# Patient Record
Sex: Male | Born: 1980
Health system: Southern US, Community
[De-identification: ages and names within clinical notes are randomized; demographics above are authoritative.]

## PROBLEM LIST (undated history)

## (undated) DIAGNOSIS — T7840XA Allergy, unspecified, initial encounter: Secondary | ICD-10-CM

## (undated) HISTORY — PX: FACIAL COSMETIC SURGERY: SHX629

## (undated) HISTORY — DX: Allergy, unspecified, initial encounter: T78.40XA

---

## 2005-09-22 ENCOUNTER — Emergency Department (HOSPITAL_COMMUNITY): Admission: EM | Admit: 2005-09-22 | Discharge: 2005-09-22 | Payer: Self-pay | Admitting: Emergency Medicine

## 2005-09-22 IMAGING — CR DG WRIST COMPLETE 3+V*R*
2 series · 2 of 2 positions shown · non-contrast
Comparison: None.

RIGHT WRIST - 4  VIEW:

CLINICAL DATA: Fall. Wrist pain.

[view not recorded (1 of 2)]
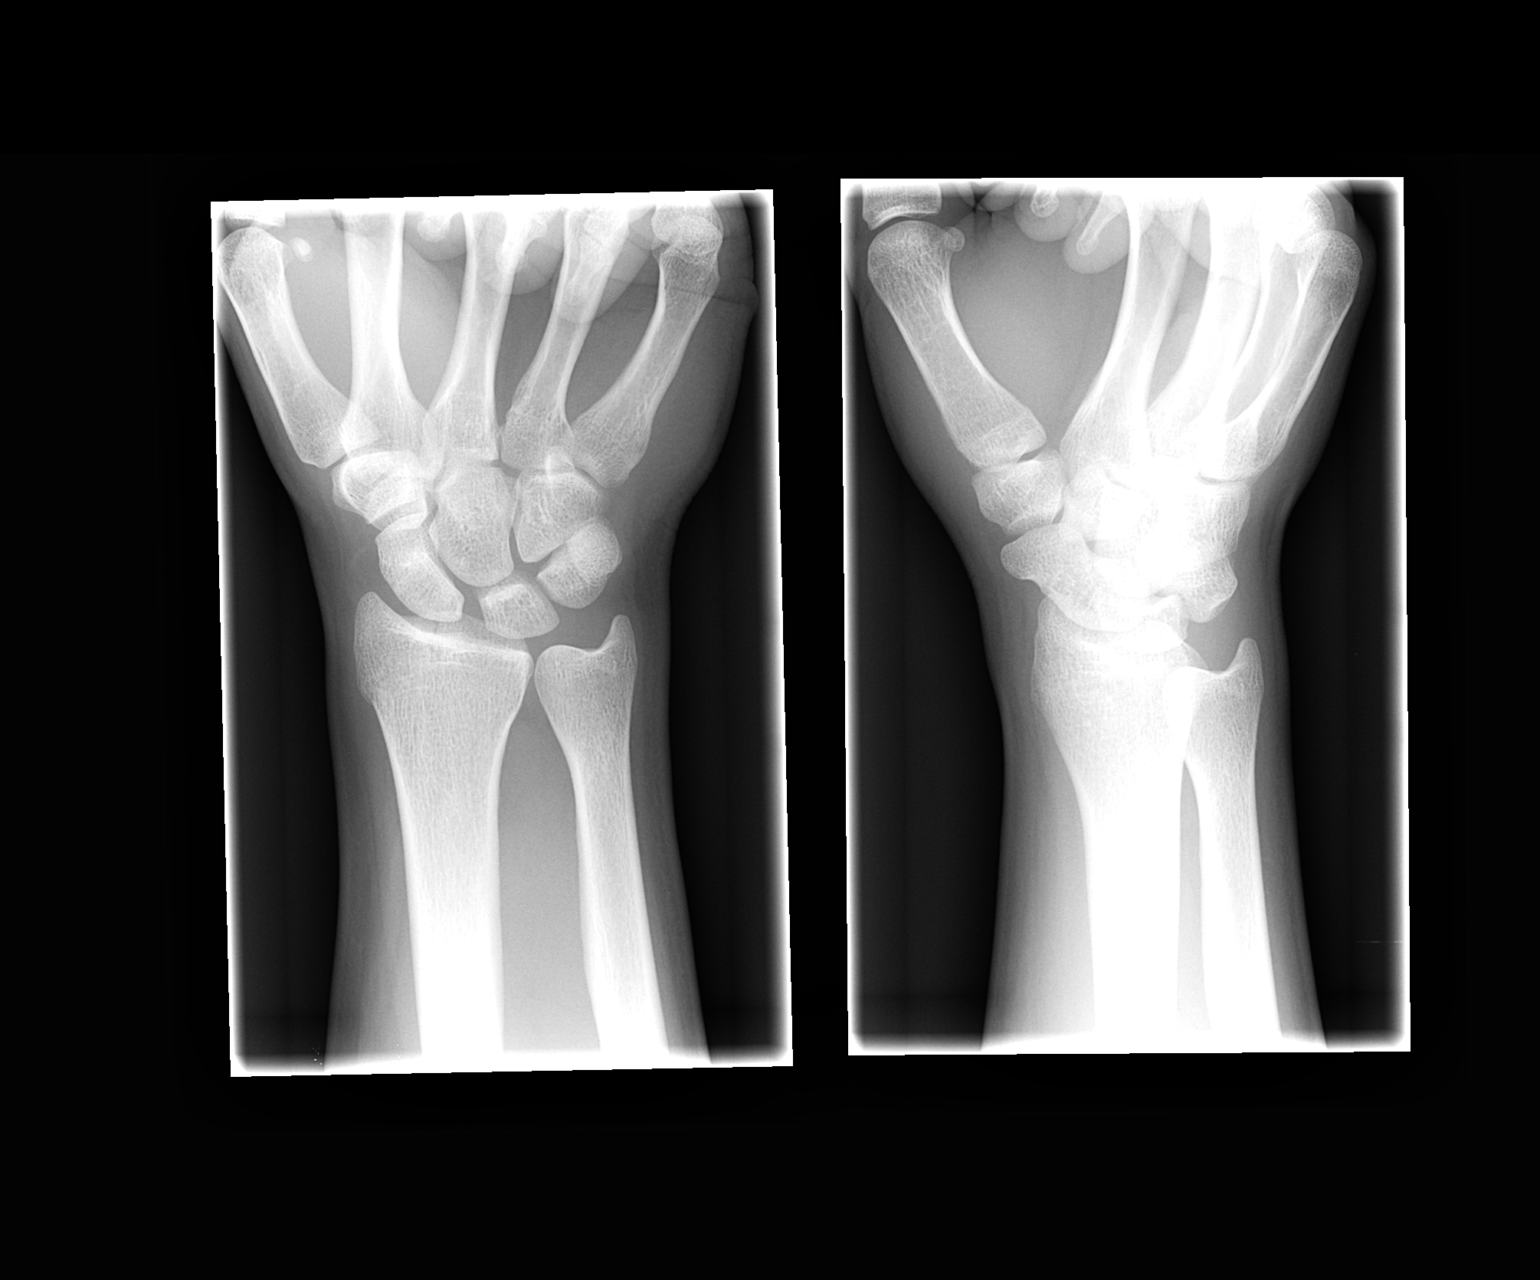

[view not recorded (2 of 2)]
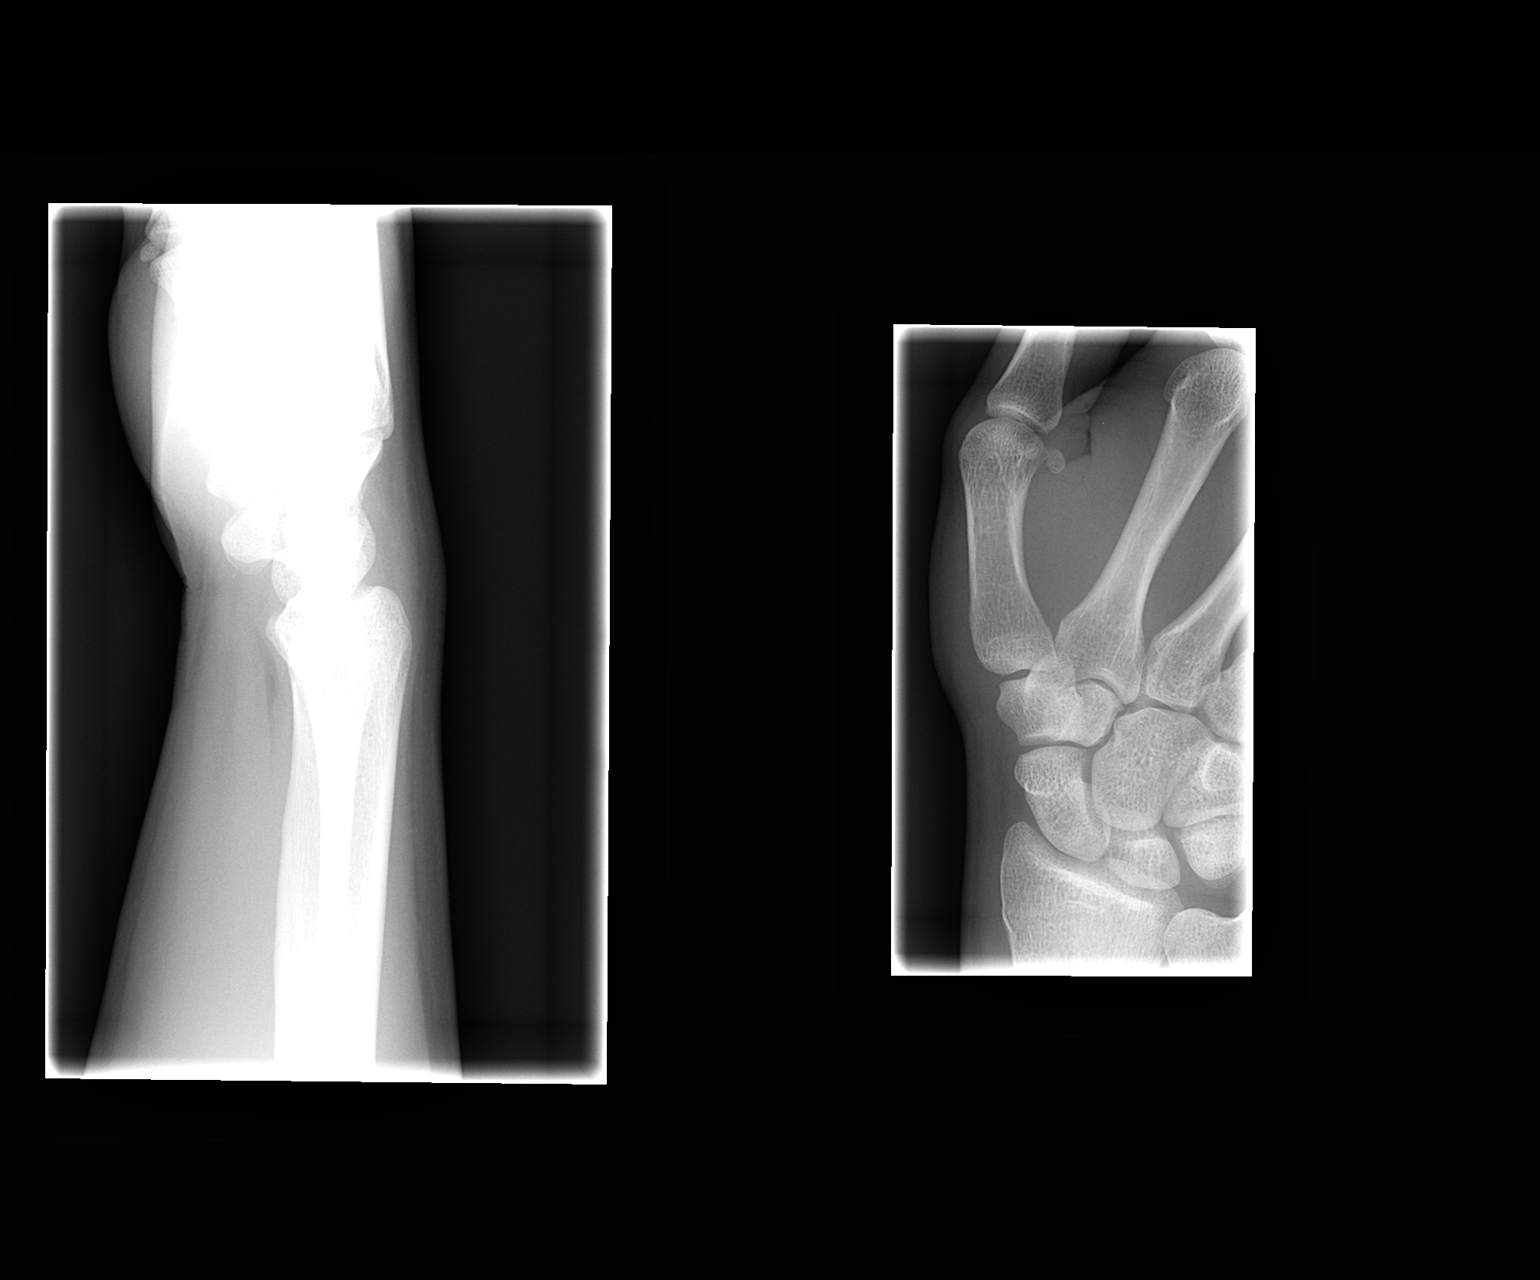

[2 of 2 positions shown; findings below may reference images not displayed]

FINDINGS: A linear lucency is seen in the distal radius, at the articular
surface, on one of the provided images. This cannot be confirmed on any of the
other images. There is borderline widening of the scapholunate distance. No
other acute bony abnormality identified.
IMPRESSION: A linear lucency in the articular surface of the distal radius raises the
question of a nondisplaced fracture, but this is seen on only one view and
cannot be confirmed on any of the other images. Please correlate clinically and
consider followup imaging to further evaluate.

## 2016-03-18 ENCOUNTER — Encounter: Payer: Self-pay | Admitting: *Deleted

## 2016-03-18 ENCOUNTER — Telehealth: Payer: Self-pay | Admitting: *Deleted

## 2016-03-18 NOTE — Telephone Encounter (Signed)
PreVisit Call completed. No acute complaints. Pt states he has had Tdap within 10 years but cannot recall date. Will sign Release Of Records when he comes in. Declines flu vaccine.

## 2016-03-19 ENCOUNTER — Ambulatory Visit (INDEPENDENT_AMBULATORY_CARE_PROVIDER_SITE_OTHER): Payer: Commercial Managed Care - PPO | Admitting: Family Medicine

## 2016-03-19 ENCOUNTER — Encounter: Payer: Self-pay | Admitting: Family Medicine

## 2016-03-19 VITALS — BP 122/78 | HR 63 | Temp 98.4°F | Ht 74.0 in | Wt 198.8 lb

## 2016-03-19 DIAGNOSIS — Z114 Encounter for screening for human immunodeficiency virus [HIV]: Secondary | ICD-10-CM | POA: Diagnosis not present

## 2016-03-19 DIAGNOSIS — Z13220 Encounter for screening for lipoid disorders: Secondary | ICD-10-CM

## 2016-03-19 DIAGNOSIS — Z0000 Encounter for general adult medical examination without abnormal findings: Secondary | ICD-10-CM

## 2016-03-19 LAB — COMPREHENSIVE METABOLIC PANEL
ALT: 14 U/L (ref 0–53)
AST: 16 U/L (ref 0–37)
Albumin: 4.6 g/dL (ref 3.5–5.2)
Alkaline Phosphatase: 50 U/L (ref 39–117)
BUN: 13 mg/dL (ref 6–23)
CO2: 32 mEq/L (ref 19–32)
Calcium: 10.3 mg/dL (ref 8.4–10.5)
Chloride: 101 mEq/L (ref 96–112)
Creatinine, Ser: 1.04 mg/dL (ref 0.40–1.50)
GFR: 86.98 mL/min (ref >60.00)
Glucose, Bld: 97 mg/dL (ref 70–99)
Potassium: 4.6 mEq/L (ref 3.5–5.1)
Sodium: 140 mEq/L (ref 135–145)
Total Bilirubin: 0.7 mg/dL (ref 0.2–1.2)
Total Protein: 7.4 g/dL (ref 6.0–8.3)

## 2016-03-19 LAB — LIPID PANEL
Cholesterol: 207 mg/dL — ABNORMAL HIGH (ref 0–200)
HDL: 57.8 mg/dL (ref >39.00)
LDL Cholesterol: 138 mg/dL — ABNORMAL HIGH (ref 0–99)
NonHDL: 149.14
Total CHOL/HDL Ratio: 4
Triglycerides: 58 mg/dL (ref 0.0–149.0)
VLDL: 11.6 mg/dL (ref 0.0–40.0)

## 2016-03-19 NOTE — Progress Notes (Signed)
Subjective:    Christopher Sexton is a 36 y.o. male and is here for a comprehensive physical exam.  HPI: He has no concerns today.  Health Maintenance Due  Topic Date Due  . HIV Screening  05/23/1997  . TETANUS/TDAP  05/23/2001  . INFLUENZA VACCINE  08/06/2015    PMHx, SurgHx, SocialHx, Medications, and Allergies were reviewed in the Visit Navigator and updated as appropriate.   Past Medical History:  Diagnosis Date  . Allergy      Past Surgical History:  Procedure Laterality Date  . FACIAL COSMETIC SURGERY     as a child     Family History  Problem Relation Age of Onset  . Cancer Mother   . Breast cancer Mother   . Cancer Paternal Grandmother   . Lung cancer Paternal Grandmother   . Cancer Paternal Grandfather   . Lung cancer Paternal Grandfather     Social History  Substance Use Topics  . Smoking status: Former Smoker    Packs/day: 0.50    Years: 6.00    Types: Cigarettes    Quit date: 01/05/2006  . Smokeless tobacco: Never Used  . Alcohol use 1.8 oz/week    2 Shots of liquor, 1 Cans of beer per week    Review of Systems:   Review of Systems  Constitutional: Negative for chills and fever.  HENT: Negative for congestion, ear pain and sinus pain.   Eyes: Negative for blurred vision and double vision.  Respiratory: Negative for cough, shortness of breath and wheezing.   Cardiovascular: Negative for chest pain, palpitations and leg swelling.  Gastrointestinal: Negative for abdominal pain, constipation, diarrhea, nausea and vomiting.  Genitourinary: Negative for dysuria.  Musculoskeletal: Negative for back pain, joint pain and neck pain.  Skin: Negative for rash.  Neurological: Negative for dizziness, weakness and headaches.  Psychiatric/Behavioral: Negative for depression, hallucinations and memory loss.    Objective:   Vitals:   03/19/16 0922  BP: 122/78  Pulse: 63  Temp: 98.4 F (36.9 C)   Body mass index is 25.52 kg/m.  General  Appearance:  Alert, cooperative, no distress, appears stated age  Head:  Normocephalic, without obvious abnormality, atraumatic  Eyes:  PERRL, conjunctiva/corneas clear, EOM's intact, fundi benign, both eyes       Ears:  Normal TM's and external ear canals, both ears  Nose: Nares normal, septum midline, mucosa normal, no drainage    or sinus tenderness  Throat: Lips, mucosa, and tongue normal; teeth and gums normal  Neck: Supple, symmetrical, trachea midline, no adenopathy; thyroid:  No enlargement/tenderness/nodules; no carotit bruit or JVD  Back:   Symmetric, no curvature, ROM normal, no CVA tenderness  Lungs:   Clear to auscultation bilaterally, respirations unlabored  Chest wall:  No tenderness or deformity  Heart:  Regular rate and rhythm, S1 and S2 normal, no murmur, rub   or gallop  Abdomen:   Soft, non-tender, bowel sounds active all four quadrants, no masses, no organomegaly  Extremities: Extremities normal, atraumatic, no cyanosis or edema  Prostate: Not done.   Skin: Skin color, texture, turgor normal, no rashes or lesions  Lymph nodes: Cervical, supraclavicular, and axillary nodes normal  Neurologic: CNII-XII grossly intact. Normal strength, sensation and reflexes throughout    Assessment/Plan:   Christopher Sexton was seen today for annual exam.  Diagnoses and all orders for this visit:  Routine physical examination -     Comprehensive metabolic panel  Screening for lipid disorders -  Lipid panel  Screening for HIV (human immunodeficiency virus) -     HIV antibody  Well Adult Exam: Labs ordered: Yes. Patient counseling was done. See below for items discussed. Discussed the patient's BMI.  The BMI BMI is in the acceptable range Follow up in one year.  Patient Counseling: [x]   Nutrition: Stressed importance of moderation in sodium/caffeine intake, saturated fat and cholesterol, caloric balance, sufficient intake of fresh fruits, vegetables, and fiber.  [x]   Stressed the  importance of regular exercise.   []   Substance Abuse: Discussed cessation/primary prevention of tobacco, alcohol, or other drug use; driving or other dangerous activities under the influence; availability of treatment for abuse.   [x]   Injury prevention: Discussed safety belts, safety helmets, smoke detector, smoking near bedding or upholstery.   []   Sexuality: Discussed sexually transmitted diseases, partner selection, use of condoms, avoidance of unintended pregnancy  and contraceptive alternatives.   [x]   Dental health: Discussed importance of regular tooth brushing, flossing, and dental visits.  [x]   Health maintenance and immunizations reviewed. Please refer to Health maintenance section.   Christopher BottomJamie Sexton CMA acting as scribe for Dr. Earlene PlaterWallace.  CMA served as Neurosurgeonscribe during this visit. History, Physical, and Plan performed by medical provider. Documentation and orders reviewed and attested to. Christopher RimaErica Zebulan Sexton, D.O.

## 2016-03-19 NOTE — Progress Notes (Signed)
Pre visit review using our clinic review tool, if applicable. No additional management support is needed unless otherwise documented below in the visit note. 

## 2016-03-20 LAB — HIV ANTIBODY (ROUTINE TESTING W REFLEX): HIV 1&2 Ab, 4th Generation: NONREACTIVE

## 2016-05-05 ENCOUNTER — Telehealth: Payer: Self-pay | Admitting: Family Medicine

## 2016-05-05 NOTE — Telephone Encounter (Signed)
Received 8 pages from [facility] interoffice mailed them to [facility] Dr.Erica Hotevilla-Bacavi. PWR.Marland Kitchen

## 2016-08-25 ENCOUNTER — Ambulatory Visit (INDEPENDENT_AMBULATORY_CARE_PROVIDER_SITE_OTHER): Payer: Commercial Managed Care - PPO | Admitting: Family Medicine

## 2016-08-25 ENCOUNTER — Encounter: Payer: Self-pay | Admitting: Family Medicine

## 2016-08-25 DIAGNOSIS — F458 Other somatoform disorders: Secondary | ICD-10-CM | POA: Diagnosis not present

## 2016-08-25 DIAGNOSIS — R09A2 Foreign body sensation, throat: Secondary | ICD-10-CM

## 2016-08-25 DIAGNOSIS — R0989 Other specified symptoms and signs involving the circulatory and respiratory systems: Secondary | ICD-10-CM | POA: Insufficient documentation

## 2016-08-25 DIAGNOSIS — R198 Other specified symptoms and signs involving the digestive system and abdomen: Secondary | ICD-10-CM

## 2016-08-25 MED ORDER — RANITIDINE HCL 150 MG PO TABS: 150.0000 mg | ORAL_TABLET | Freq: Two times a day (BID) | ORAL | 0 refills | Status: DC

## 2016-08-25 NOTE — Progress Notes (Signed)
Christopher Sexton is a 36 y.o. male here for an acute visit.  History of Present Illness:   Insurance claims handler, CMA, acting as scribe for Dr. Earlene Sexton.  HPI:  Patient comes in today for an "uncomfortable feeling" in his throat.  States this began a couple of weeks ago.  He states he can feel a lump in the center of his throat at times.  Tends to occur in the middle of the day and not at night or on the morning.  No pain.  He cannot feel this when palpating the outside of his throat.  PMHx, SurgHx, SocialHx, Medications, and Allergies were reviewed in the Visit Navigator and updated as appropriate.  Current Medications:   Current Outpatient Prescriptions:  Marland Kitchen  Multiple Vitamin (MULTIVITAMIN) tablet, Take 1 tablet by mouth daily., Disp: , Rfl:  .  ranitidine (ZANTAC) 150 MG tablet, Take 1 tablet (150 mg total) by mouth 2 (two) times daily., Disp: 30 tablet, Rfl: 0   Allergies  Allergen Reactions  . Bee Venom Hives and Swelling    Pt has been prescribed epi pens for this.    Review of Systems:   Pertinent items are noted in the HPI. Otherwise, ROS is negative.  Vitals:   Vitals:   08/25/16 1008  BP: 126/74  Pulse: 73  Temp: 98.9 F (37.2 C)  TempSrc: Oral  SpO2: 99%  Weight: 193 lb 3.2 oz (87.6 kg)  Height: 6\' 2"  (1.88 m)     Body mass index is 24.81 kg/m. Physical Exam:   Physical Exam  Constitutional: He is oriented to person, place, and time. He appears well-developed and well-nourished. No distress.  HENT:  Head: Normocephalic and atraumatic.  Right Ear: External ear normal.  Left Ear: External ear normal.  Nose: Nose normal.  Mouth/Throat: Oropharynx is clear and moist.  Eyes: Pupils are equal, round, and reactive to light. Conjunctivae and EOM are normal.  Neck: Normal range of motion. Neck supple.  Cardiovascular: Normal rate, regular rhythm, normal heart sounds and intact distal pulses.   Pulmonary/Chest: Effort normal and breath sounds normal.  Abdominal: Soft.  Bowel sounds are normal.  Musculoskeletal: Normal range of motion.  Neurological: He is alert and oriented to person, place, and time.  Skin: Skin is warm and dry.  Psychiatric: He has a normal mood and affect. His behavior is normal. Judgment and thought content normal.  Nursing note and vitals reviewed.   Assessment and Plan:   Christopher Sexton was seen today for acute visit.  Diagnoses and all orders for this visit:  Globus pharyngeus Comments: No red flags. DDx includes PND, reflux, globus sensation. If no improvement in 2 weeks, to ENT. Orders: -     ranitidine (ZANTAC) 150 MG tablet; Take 1 tablet (150 mg total) by mouth 2 (two) times daily.    . Reviewed expectations re: course of current medical issues. . Discussed self-management of symptoms. . Outlined signs and symptoms indicating need for more acute intervention. . Patient verbalized understanding and all questions were answered. Marland Kitchen Health Maintenance issues including appropriate healthy diet, exercise, and smoking avoidance were discussed with patient. . See orders for this visit as documented in the electronic medical record. . Patient received an After Visit Summary.  CMA served as Neurosurgeon during this visit. History, Physical, and Plan performed by medical provider. The above documentation has been reviewed and is accurate and complete. Christopher Sexton, D.O.  Christopher Rima, DO [place] 08/28/2016  No future appointments.

## 2016-08-25 NOTE — Patient Instructions (Addendum)
Globus Pharyngeus Globus pharyngeus is a condition that makes it feel like you have a lump in your throat. It may also feel like you have something stuck in the front of your throat. This feeling may come and go. It is not painful, and it does not make it harder to swallow food or liquid. Globus pharyngeus does not cause changes that a health care provider can see during a physical exam. This condition usually goes away without treatment. What are the causes? Often, no cause can be found. The most common cause of globus pharyngeus is a condition that causes stomach juices to flow back up into the throat (gastroesophageal reflux). Other possible causes include:  Overstimulation of nerves that control swallowing.  Irritation of nerves that control swallowing (neuralgia).  An enlarged gland in the lower neck (thyroid gland).  Growth of tonsil tissue at the base of the tongue (lingual tonsil).  Anxiety.  Depression.  What are the signs or symptoms? The main symptom of this condition is a feeling of a lump in your throat. This feeling usually comes and goes. How is this diagnosed? This condition may be diagnosed after other conditions have been ruled out. You may have tests, such as:  A swallow study.  Ear, nose, and throat evaluation.  An exam of your throat using a thin, flexible tube with a light and camera on the end (endoscopy).  How is this treated? This condition may go away on its own, without treatment. In some cases, antidepressant medicines may be helpful. Follow these instructions at home:  Follow instructions from your health care provider about eating or drinking restrictions.  Take over-the-counter and prescription medicines only as told by your health care provider.  Keep all follow-up visits as told by your health care provider. This is important.  Follow instructions from your health care provider about home care for gastroesophageal reflux. Your health care  provider may recommend that you: ? Do not eat or drink anything that causes heartburn. ? Do not eat heavy meals close to bedtime. ? Do not drink caffeine. ? Do not drink alcohol. ? Raise the head of your bed. ? Sleep on your left side. Contact a health care provider if:  Your symptoms get worse.  You have throat pain.  You have trouble swallowing.  Food or liquid comes back up into your mouth.  You lose weight without trying. Get help right away if:  You develop swelling in your throat. Summary  Globus pharyngeus is a condition that makes it feel like you have a lump in your throat.  This condition usually goes away without treatment. This information is not intended to replace advice given to you by your health care provider. Make sure you discuss any questions you have with your health care provider. Document Released: 08/28/2015 Document Revised: 08/28/2015 Document Reviewed: 08/28/2015 Elsevier Interactive Patient Education  2018 [employer]  

## 2016-11-07 DIAGNOSIS — Z23 Encounter for immunization: Secondary | ICD-10-CM | POA: Diagnosis not present

## 2017-02-18 ENCOUNTER — Other Ambulatory Visit: Payer: Self-pay | Admitting: Family Medicine

## 2017-02-18 DIAGNOSIS — R0989 Other specified symptoms and signs involving the circulatory and respiratory systems: Secondary | ICD-10-CM

## 2017-02-18 DIAGNOSIS — R198 Other specified symptoms and signs involving the digestive system and abdomen: Secondary | ICD-10-CM

## 2017-09-17 ENCOUNTER — Ambulatory Visit: Payer: BLUE CROSS/BLUE SHIELD | Admitting: Family Medicine

## 2017-09-17 ENCOUNTER — Encounter: Payer: Self-pay | Admitting: Family Medicine

## 2017-09-17 VITALS — BP 118/76 | HR 78 | Temp 98.8°F | Ht 74.0 in | Wt 194.0 lb

## 2017-09-17 DIAGNOSIS — M791 Myalgia, unspecified site: Secondary | ICD-10-CM | POA: Diagnosis not present

## 2017-09-17 LAB — COMPREHENSIVE METABOLIC PANEL
ALK PHOS: 59 U/L (ref 39–117)
ALT: 14 U/L (ref 0–53)
AST: 13 U/L (ref 0–37)
Albumin: 4.6 g/dL (ref 3.5–5.2)
BILIRUBIN TOTAL: 0.6 mg/dL (ref 0.2–1.2)
BUN: 15 mg/dL (ref 6–23)
CO2: 30 mEq/L (ref 19–32)
CREATININE: 1.16 mg/dL (ref 0.40–1.50)
Calcium: 10.1 mg/dL (ref 8.4–10.5)
Chloride: 102 mEq/L (ref 96–112)
GFR: 76.01 mL/min (ref >60.00)
GLUCOSE: 71 mg/dL (ref 70–99)
Potassium: 4.3 mEq/L (ref 3.5–5.1)
Sodium: 143 mEq/L (ref 135–145)
TOTAL PROTEIN: 7.1 g/dL (ref 6.0–8.3)

## 2017-09-17 LAB — CBC WITH DIFFERENTIAL/PLATELET
BASOS ABS: 0 10*3/uL (ref 0.0–0.1)
Basophils Relative: 0.5 % (ref 0.0–3.0)
Eosinophils Absolute: 0.1 10*3/uL (ref 0.0–0.7)
Eosinophils Relative: 1.8 % (ref 0.0–5.0)
HCT: 44.7 % (ref 39.0–52.0)
Hemoglobin: 15.6 g/dL (ref 13.0–17.0)
LYMPHS ABS: 2.4 10*3/uL (ref 0.7–4.0)
LYMPHS PCT: 37.9 % (ref 12.0–46.0)
MCHC: 34.8 g/dL (ref 30.0–36.0)
MCV: 91 fl (ref 78.0–100.0)
MONOS PCT: 8.1 % (ref 3.0–12.0)
Monocytes Absolute: 0.5 10*3/uL (ref 0.1–1.0)
NEUTROS PCT: 51.7 % (ref 43.0–77.0)
Neutro Abs: 3.3 10*3/uL (ref 1.4–7.7)
Platelets: 244 10*3/uL (ref 150.0–400.0)
RBC: 4.91 Mil/uL (ref 4.22–5.81)
RDW: 12.8 % (ref 11.5–15.5)
WBC: 6.3 10*3/uL (ref 4.0–10.5)

## 2017-09-17 LAB — SEDIMENTATION RATE: Sed Rate: 3 mm/hr (ref 0–15)

## 2017-09-17 LAB — C-REACTIVE PROTEIN: CRP: 0.1 mg/dL — ABNORMAL LOW (ref 0.5–20.0)

## 2017-09-17 LAB — TSH: TSH: 0.83 u[IU]/mL (ref 0.35–4.50)

## 2017-09-17 MED ORDER — BACLOFEN 10 MG PO TABS
10.0000 mg | ORAL_TABLET | Freq: Three times a day (TID) | ORAL | 0 refills | Status: DC
Start: 2017-09-17 — End: 2018-02-28

## 2017-09-17 NOTE — Progress Notes (Signed)
Patient: Christopher Sexton MRN: [id number] DOB: Feb 09, 1980 PCP: Helane Rima, DO     Subjective:  Chief Complaint  Patient presents with  . chest muscles/abd muscles sore    x 1 month    HPI: The patient is a 37 y.o. male who presents today for chest/abdominal muscle soreness x 1 month. His symptoms are intermittent. One month ago when symptoms started he can not find a precipitating factors. He does not work out and no recent exercise routine. No new diets and no new medication. He seems to notice his symptoms more when he is traveling in his car. This AM pain seemed to be located on his right side of his rib cage. Would come and go about 15 times so he came in for appointment today. Sometimes the pain lingers and sometimes its quick. Pain travels all over his stomach and in his pec regions. Nowhere else on his body. Pain is tolerable and is dull. No radiation when he feels the pain. No symptoms in his legs/arms. He did take ibuprofen and this seemed to help.  He is healthy with no medical problems and no medication. No significant medical history. He is tall and skinny. Not double jointed and had no issues with anything growing up. He denies any joint pain or swelling anywhere else. No rashes or skin complaints. No vision complaints. No radicular symptoms or weakness.   Review of Systems  Constitutional: Negative for fatigue.  Eyes: Negative for visual disturbance.  Respiratory: Positive for chest tightness. Negative for shortness of breath.   Cardiovascular: Negative for chest pain.  Gastrointestinal: Negative for abdominal pain and nausea.  Neurological: Negative for dizziness and headaches.    Allergies Patient is allergic to bee venom.  Past Medical History Patient  has a past medical history of Allergy.  Surgical History Patient  has a past surgical history that includes Facial cosmetic surgery.  Family History Pateint's family history includes Breast cancer in his mother;  Cancer in his mother, paternal grandfather, and paternal grandmother; Lung cancer in his paternal grandfather and paternal grandmother.  Social History Patient  reports that he quit smoking about 11 years ago. His smoking use included cigarettes. He has a 3.00 pack-year smoking history. He has never used smokeless tobacco. He reports that he drinks about 3.0 standard drinks of alcohol per week. He reports that he does not use drugs.    Objective: Vitals:   09/17/17 1307  BP: 118/76  Pulse: 78  Temp: 98.8 F (37.1 C)  TempSrc: Oral  SpO2: 98%  Weight: 194 lb (88 kg)  Height: 6\' 2"  (1.88 m)    Body mass index is 24.91 kg/m.  Physical Exam  Constitutional: He is oriented to person, place, and time. He appears well-developed and well-nourished.  HENT:  Right Ear: External ear normal.  Left Ear: External ear normal.  Mild cobblestoning on posterior pharynx   Eyes: Pupils are equal, round, and reactive to light. Conjunctivae and EOM are normal.  Neck: Normal range of motion. Neck supple. No thyromegaly present.  Cardiovascular: Normal rate, regular rhythm, normal heart sounds and intact distal pulses.  No murmur heard. Pulmonary/Chest: Effort normal and breath sounds normal.  Abdominal: Soft. Bowel sounds are normal. He exhibits no distension. There is no tenderness.  Musculoskeletal: Normal range of motion. He exhibits no edema, tenderness or deformity.  No reproducible tenderness on palpation over chest/ribs.   Lymphadenopathy:    He has no cervical adenopathy.  Neurological: He is alert and oriented  to person, place, and time. He displays normal reflexes. No cranial nerve deficit. Coordination normal.  Skin: Skin is warm and dry. No rash noted.  Psychiatric: He has a normal mood and affect. His behavior is normal.  Vitals reviewed.      Assessment/plan: 1. Myalgia Unsure at this point and large differential. Responds to anti-inflammatories so will continue these PRN.  Checking labs to rule out other issues. Anterior cutaneous nerve entrapment on differential, but wouldn't have symptoms in his pectoralis muscles. Also want him to figure out if he is having this after he wrestles with the kids or does sit in car for long trips. Will also do trial of muscle relaxer prn. If symptoms continue/worsen will have him follow up with pcp for further care/work up.  - CBC with Differential/Platelet - Comprehensive metabolic panel - TSH - Sedimentation rate - C-reactive protein - ANA   Return if symptoms worsen or fail to improve.    Orland MustardAllison Emerick Weatherly, MD [facility]   09/17/2017

## 2017-09-22 LAB — ANA: ANA: NEGATIVE

## 2017-10-01 ENCOUNTER — Ambulatory Visit: Payer: Commercial Managed Care - PPO | Admitting: Family Medicine

## 2018-02-28 ENCOUNTER — Encounter: Payer: Self-pay | Admitting: Family Medicine

## 2018-02-28 ENCOUNTER — Ambulatory Visit: Payer: BLUE CROSS/BLUE SHIELD | Admitting: Family Medicine

## 2018-02-28 VITALS — BP 108/64 | HR 57 | Temp 98.2°F | Ht 74.0 in | Wt 199.8 lb

## 2018-02-28 DIAGNOSIS — G44209 Tension-type headache, unspecified, not intractable: Secondary | ICD-10-CM

## 2018-02-28 MED ORDER — BACLOFEN 10 MG PO TABS: 10.0000 mg | ORAL_TABLET | Freq: Three times a day (TID) | ORAL | 0 refills | Status: DC

## 2018-02-28 MED ORDER — KETOROLAC TROMETHAMINE 60 MG/2ML IM SOLN
60.0000 mg | Freq: Once | INTRAMUSCULAR | Status: AC
  Administered 2018-02-28: 60 mg via INTRAMUSCULAR

## 2018-02-28 NOTE — Patient Instructions (Addendum)
Will give you toradol shot today. Don't take ibuprofen/advil/etc for 6-8 hours after shot. Can take tylenol.   -I prefer ibuprofen 800mg  up to three times a day with food 1000mg  of tylenol three times a day max.   Let us know if you are not getting better after a few days or worsening symptoms.    Tension Headache, Adult A tension headache is pain, pressure, or aching in your head. Tension headaches can last from 30 minutes to several days. Follow these instructions at home: Managing pain  Take over-the-counter and prescription medicines only as told by your doctor.  When you have a headache, lie down in a dark, quiet room.  If told, put ice on your head and neck: ? Put ice in a plastic bag. ? Place a towel between your skin and the bag. ? Leave the ice on for 20 minutes, 2-3 times a day.  If told, put heat on the back of your neck. Do this as often as your doctor tells you to. Use the kind of heat that your doctor recommends, such as a moist heat pack or a heating pad. ? Place a towel between your skin and the heat. ? Leave the heat on for 20-30 minutes. ? Remove the heat if your skin turns bright red. Eating and drinking  Eat meals on a regular schedule.  Watch how much alcohol you drink: ? If you are a woman and are not pregnant, do not drink more than 1 drink a day. ? If you are a man, do not drink more than 2 drinks a day.  Drink enough fluid to keep your pee (urine) pale yellow.  Do not use a lot of caffeine, or stop using caffeine. Lifestyle  Get enough sleep. Get 7-9 hours of sleep each night. Or get the amount of sleep that your doctor tells you to.  At bedtime, remove all electronic devices from your room. Examples of electronic devices are computers, phones, and tablets.  Find ways to lessen your stress. Some things that can lessen stress are: ? Exercise. ? Deep breathing. ? Yoga. ? Music. ? Positive thoughts.  Sit up straight. Do not tighten (tense) your  muscles.  Do not use any products that have nicotine or tobacco in them, such as cigarettes and e-cigarettes. If you need help quitting, ask your doctor. General instructions   Keep all follow-up visits as told by your doctor. This is important.  Avoid things that can bring on headaches. Keep a journal to find out if certain things bring on headaches. For example, write down: ? What you eat and drink. ? How much sleep you get. ? Any change to your diet or medicines. Contact a doctor if:  Your headache does not get better.  Your headache comes back.  You have a headache and sounds, light, or smells bother you.  You feel sick to your stomach (nauseous) or you throw up (vomit).  Your stomach hurts. Get help right away if:  You suddenly get a very bad headache along with any of these: ? A stiff neck. ? Feeling sick to your stomach. ? Throwing up. ? Feeling weak. ? Trouble seeing. ? Feeling short of breath. ? A rash. ? Feeling unusually sleepy. ? Trouble speaking. ? Pain in your eye or ear. ? Trouble walking or balancing. ? Feeling like you will pass out (faint). ? Passing out. Summary  A tension headache is pain, pressure, or aching in your head.  Tension headaches can  last from 30 minutes to several days.  Lifestyle changes and medicines may help relieve pain. This information is not intended to replace advice given to you by your health care provider. Make sure you discuss any questions you have with your health care provider. Document Released: 03/18/2009 Document Revised: 04/03/2016 Document Reviewed: 04/03/2016 Elsevier Interactive Patient Education  2019 [employer].

## 2018-02-28 NOTE — Progress Notes (Signed)
Patient: DUILIO STETLER MRN: [id number] DOB: [birth year] PCP: Helane Rima, DO     Subjective:  Chief Complaint  Patient presents with  . Headache    HPI: The patient is a 38 y.o. male who presents today for headache.  He has had a headache for 2 days. He usually never has headaches. His mom and his brother have migraines, but he has no history. If he does get a headache they normally go away quickly. He took advil yesterday Am and evening and it didn't really touch it. He took some more this AM. He states headache started out on the right side of his entire head then this AM again, started out with no headache then came on the right side again. Rated as a 5-6/10 and is dull in nature. More pressure in nature. He doesn't feel more stressed than normal, sleeping normal (8-10 hours) and not drank more than normal. He denies any nausea/vomiting/vision changes. No light sensitivity. Not worst headache of his life, doesn't wake him up and no focal deficits.   Review of Systems  Constitutional: Negative for fatigue.  HENT: Negative for congestion, ear pain, rhinorrhea, sinus pressure, sinus pain and sore throat.   Respiratory: Negative for shortness of breath and wheezing.   Cardiovascular: Negative for chest pain.  Gastrointestinal: Negative for abdominal pain, nausea and vomiting.  Musculoskeletal: Negative for neck pain and neck stiffness.  Neurological: Positive for headaches. Negative for dizziness.  Hematological: Negative for adenopathy.  Psychiatric/Behavioral: Negative for sleep disturbance.    Allergies Patient is allergic to bee venom.  Past Medical History Patient  has a past medical history of Allergy.  Surgical History Patient  has a past surgical history that includes Facial cosmetic surgery.  Family History Pateint's family history includes Breast cancer in his mother; Cancer in his mother, paternal grandfather, and paternal grandmother; Lung cancer in his  paternal grandfather and paternal grandmother.  Social History Patient  reports that he quit smoking about 12 years ago. His smoking use included cigarettes. He has a 3.00 pack-year smoking history. He has never used smokeless tobacco. He reports current alcohol use of about 3.0 standard drinks of alcohol per week. He reports that he does not use drugs.    Objective: Vitals:   02/28/18 1035  BP: 108/64  Pulse: (!) 57  Temp: 98.2 F (36.8 C)  TempSrc: Oral  SpO2: 98%  Weight: 199 lb 12.8 oz (90.6 kg)  Height: 6\' 2"  (1.88 m)    Body mass index is 25.65 kg/m.  Physical Exam Vitals signs reviewed.  Constitutional:      Appearance: He is well-developed.  HENT:     Head: Normocephalic and atraumatic.     Mouth/Throat:     Mouth: Mucous membranes are moist.     Pharynx: Oropharynx is clear.  Eyes:     Extraocular Movements: Extraocular movements intact.     Pupils: Pupils are equal, round, and reactive to light.  Neck:     Musculoskeletal: Normal range of motion and neck supple.  Cardiovascular:     Rate and Rhythm: Normal rate and regular rhythm.     Heart sounds: Normal heart sounds.  Pulmonary:     Effort: Pulmonary effort is normal.     Breath sounds: Normal breath sounds.  Abdominal:     General: Bowel sounds are normal.     Palpations: Abdomen is soft.  Skin:    General: Skin is warm and dry.  Neurological:     Mental  Status: He is alert.     Cranial Nerves: No cranial nerve deficit, dysarthria or facial asymmetry.     Sensory: No sensory deficit.     Coordination: Coordination normal.     Gait: Gait normal.  Psychiatric:        Mood and Affect: Mood normal.        Behavior: Behavior normal.        Assessment/plan: 1. Acute non intractable tension-type headache toradol in office today. Discussed these can persist for a couple of weeks. Usually stress related. Red flags/precautions discussed. If not better in a few days would let us know so we can  image/further investigate. Also recommended ibuprofen and could try tylenol. Dosing discussed/written down. No nsaid x 6-8 hours after shot.  Rest, fluids.  - ketorolac (TORADOL) injection 60 mg    Return if symptoms worsen or fail to improve.   Orland Mustard, MD [facility]   02/28/2018

## 2018-05-29 ENCOUNTER — Encounter: Payer: Self-pay | Admitting: Family Medicine

## 2018-05-30 ENCOUNTER — Emergency Department (HOSPITAL_COMMUNITY)
Admission: EM | Admit: 2018-05-30 | Discharge: 2018-05-30 | Disposition: A | Discharge status: Home / Self Care | Payer: BLUE CROSS/BLUE SHIELD | Attending: Emergency Medicine | Admitting: Emergency Medicine

## 2018-05-30 ENCOUNTER — Other Ambulatory Visit: Payer: Self-pay

## 2018-05-30 ENCOUNTER — Encounter (HOSPITAL_COMMUNITY): Payer: Self-pay | Admitting: *Deleted

## 2018-05-30 ENCOUNTER — Emergency Department (HOSPITAL_COMMUNITY): Payer: BLUE CROSS/BLUE SHIELD

## 2018-05-30 DIAGNOSIS — R0781 Pleurodynia: Secondary | ICD-10-CM | POA: Diagnosis not present

## 2018-05-30 DIAGNOSIS — Z87891 Personal history of nicotine dependence: Secondary | ICD-10-CM | POA: Insufficient documentation

## 2018-05-30 DIAGNOSIS — Z79899 Other long term (current) drug therapy: Secondary | ICD-10-CM | POA: Diagnosis not present

## 2018-05-30 DIAGNOSIS — R1011 Right upper quadrant pain: Secondary | ICD-10-CM | POA: Diagnosis not present

## 2018-05-30 DIAGNOSIS — R109 Unspecified abdominal pain: Secondary | ICD-10-CM | POA: Diagnosis not present

## 2018-05-30 LAB — CBC WITH DIFFERENTIAL/PLATELET
Abs Immature Granulocytes: 0.02 10*3/uL (ref 0.00–0.07)
Basophils Absolute: 0 10*3/uL (ref 0.0–0.1)
Basophils Relative: 0 %
Eosinophils Absolute: 0.1 10*3/uL (ref 0.0–0.5)
Eosinophils Relative: 1 %
HCT: 42.7 % (ref 39.0–52.0)
Hemoglobin: 15.1 g/dL (ref 13.0–17.0)
Immature Granulocytes: 0 %
Lymphocytes Relative: 30 %
Lymphs Abs: 2.2 10*3/uL (ref 0.7–4.0)
MCH: 31.3 pg (ref 26.0–34.0)
MCHC: 35.4 g/dL (ref 30.0–36.0)
MCV: 88.4 fL (ref 80.0–100.0)
Monocytes Absolute: 0.7 10*3/uL (ref 0.1–1.0)
Monocytes Relative: 10 %
Neutro Abs: 4.3 10*3/uL (ref 1.7–7.7)
Neutrophils Relative %: 59 %
Platelets: 204 10*3/uL (ref 150–400)
RBC: 4.83 MIL/uL (ref 4.22–5.81)
RDW: 11.6 % (ref 11.5–15.5)
WBC: 7.4 10*3/uL (ref 4.0–10.5)
nRBC: 0 % (ref 0.0–0.2)

## 2018-05-30 LAB — BASIC METABOLIC PANEL
Anion gap: 12 (ref 5–15)
BUN: 18 mg/dL (ref 6–20)
CO2: 26 mmol/L (ref 22–32)
Calcium: 9.6 mg/dL (ref 8.9–10.3)
Chloride: 102 mmol/L (ref 98–111)
Creatinine, Ser: 1.12 mg/dL (ref 0.61–1.24)
GFR calc Af Amer: >60 mL/min (ref >60)
GFR calc non Af Amer: >60 mL/min (ref >60)
Glucose, Bld: 111 mg/dL — ABNORMAL HIGH (ref 70–99)
Potassium: 4.4 mmol/L (ref 3.5–5.1)
Sodium: 140 mmol/L (ref 135–145)

## 2018-05-30 LAB — TROPONIN I: Troponin I: <0.03 ng/mL (ref <0.03)

## 2018-05-30 LAB — D-DIMER, QUANTITATIVE (NOT AT ARMC): D-Dimer, Quant: <0.27 ug/mL-FEU (ref 0.00–0.50)

## 2018-05-30 IMAGING — CR RIGHT RIBS AND CHEST - 3+ VIEW
3 series · 3 of 3 positions shown · non-contrast
Comparison: None.

CLINICAL DATA: Pain

EXAM:
RIGHT RIBS AND CHEST - 3+ VIEW

[chest pa]
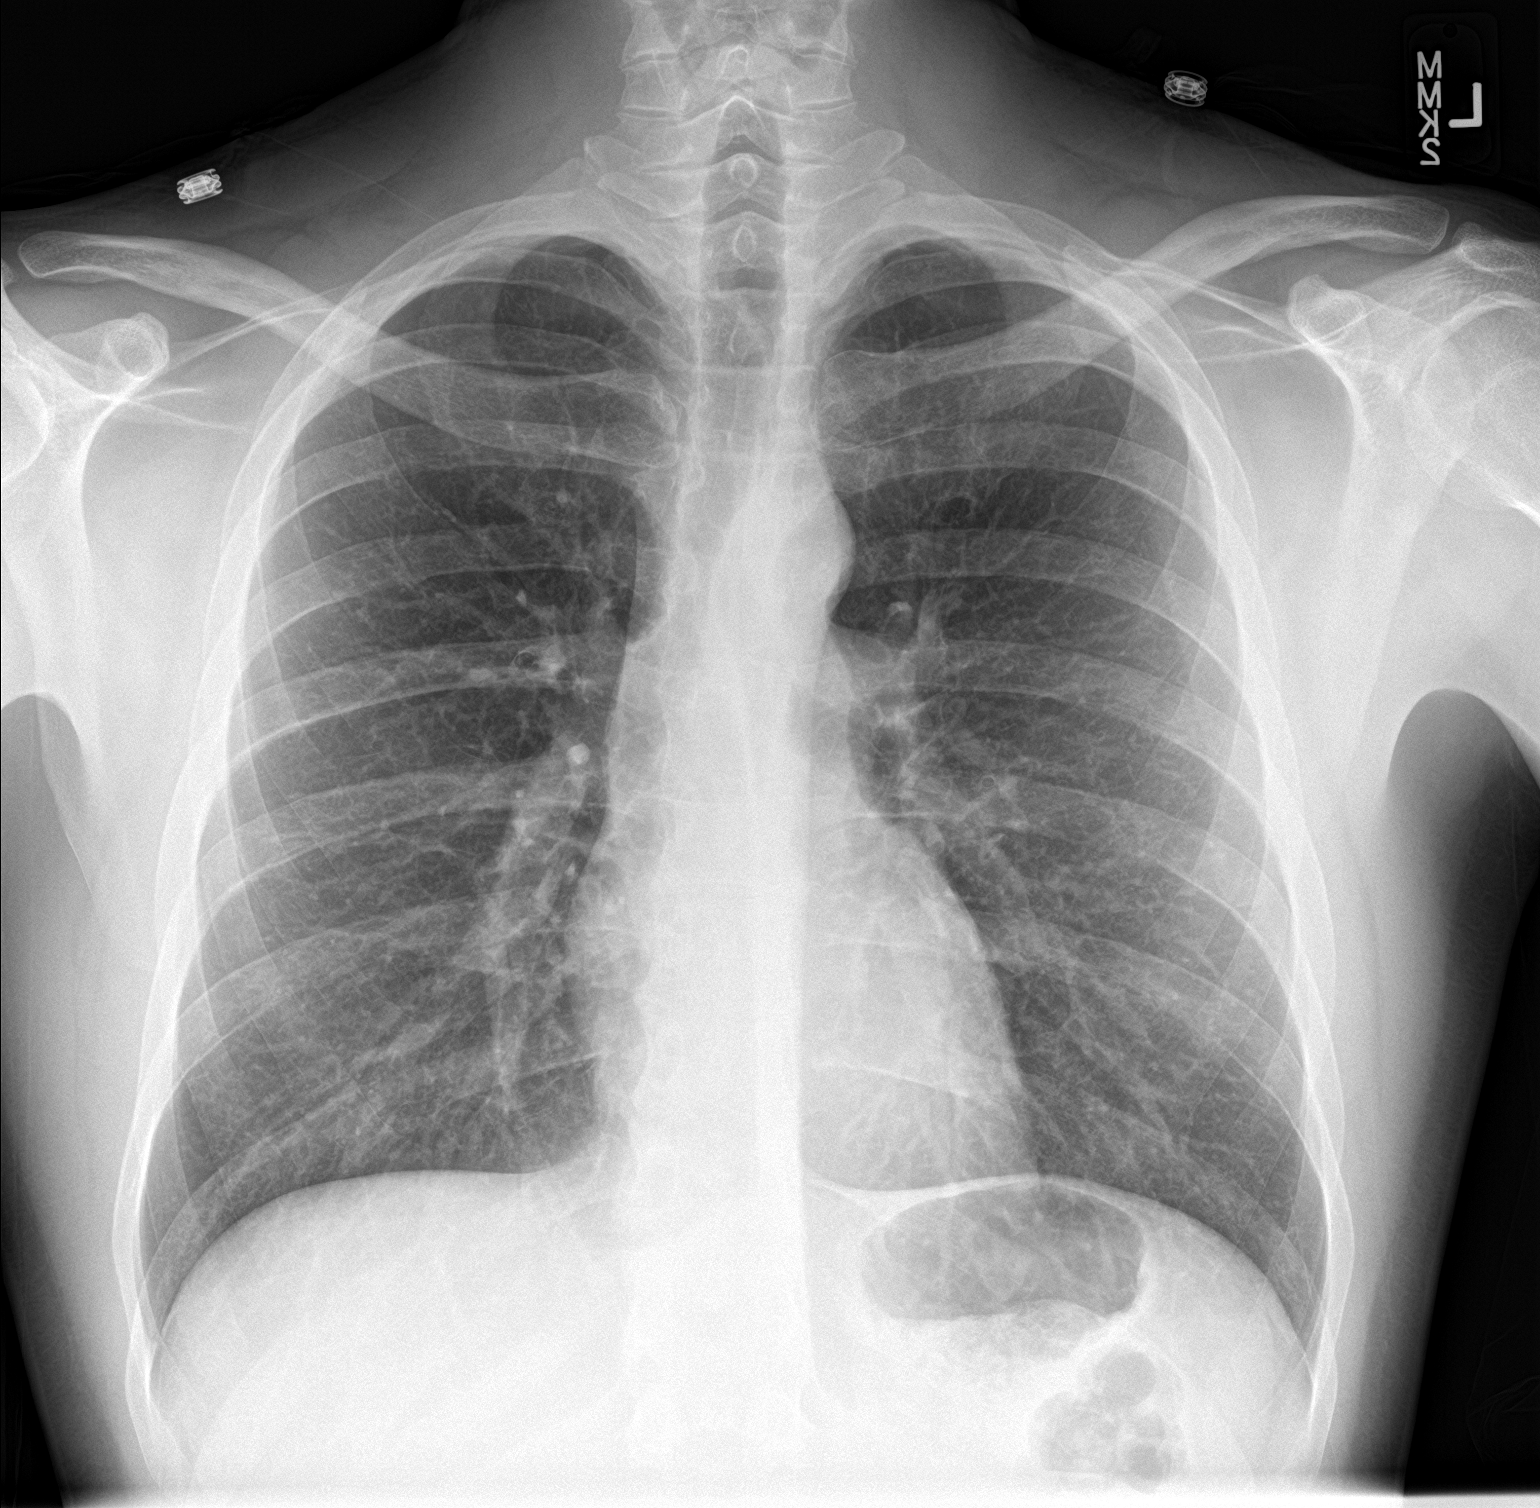

[rib pa]
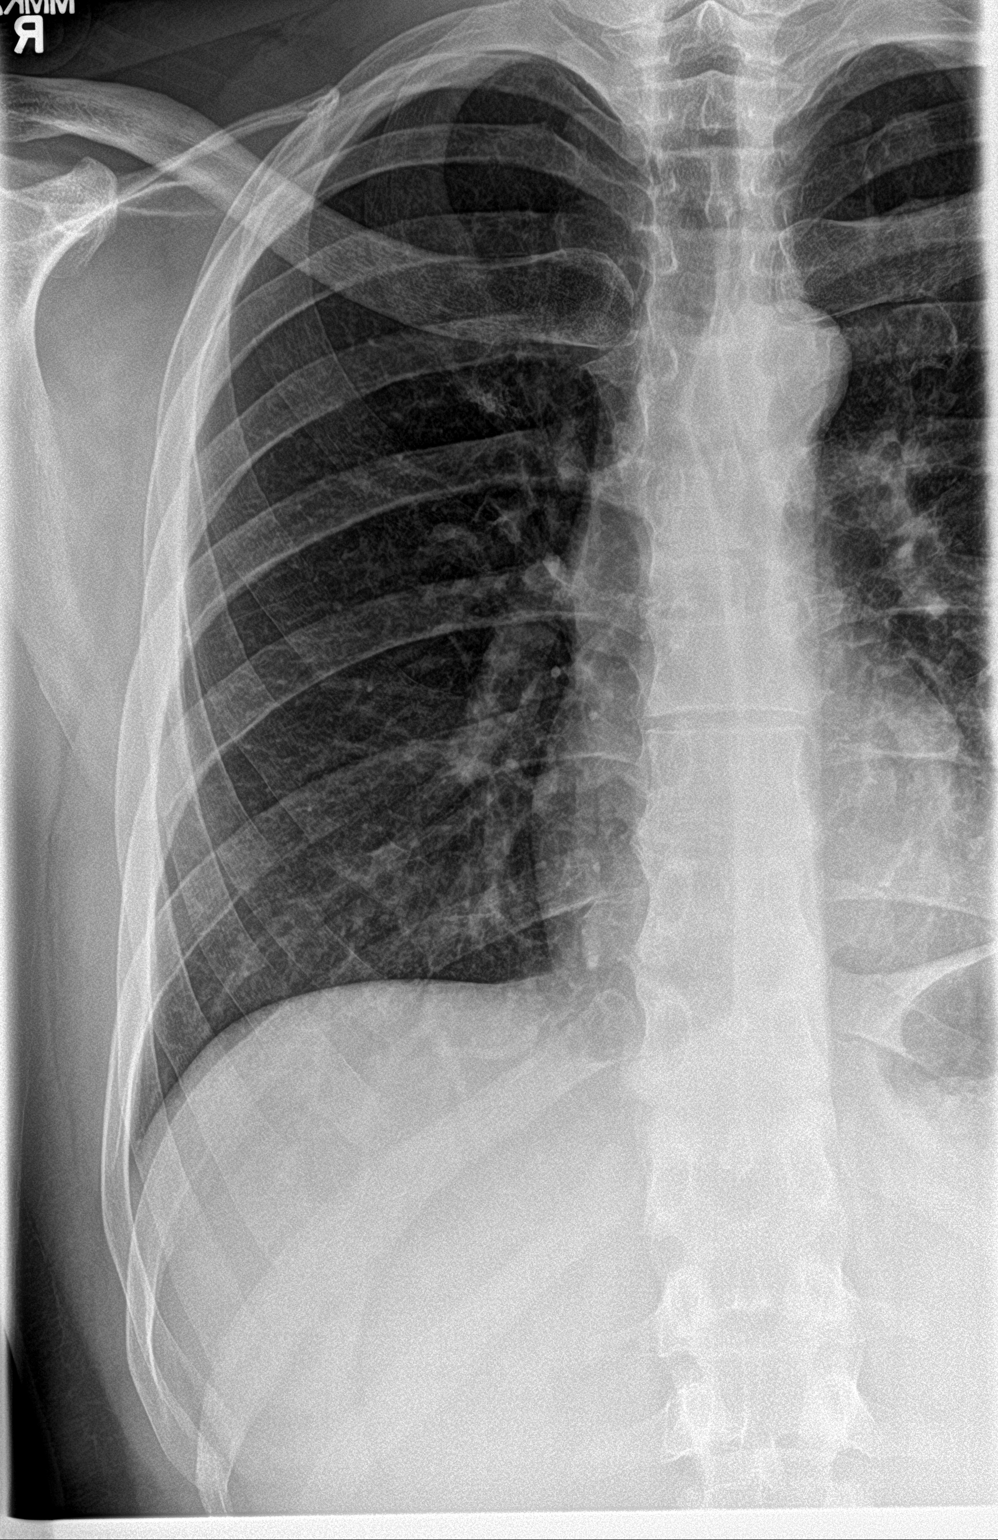

[rib pa obl]
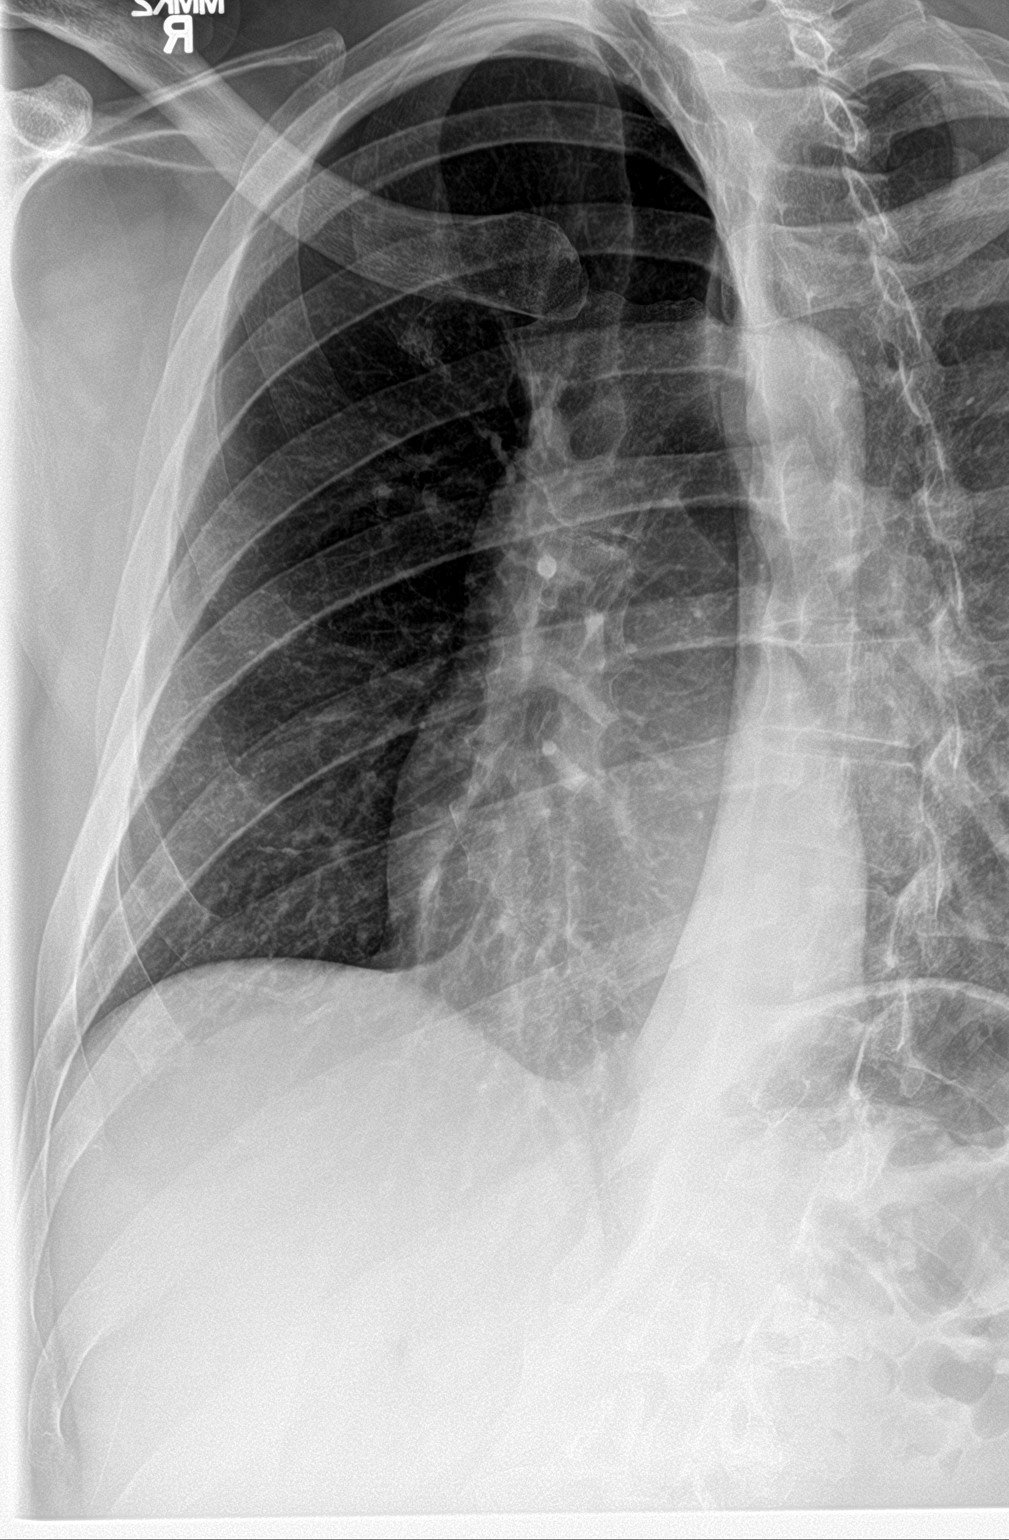

[3 of 3 positions shown; findings below may reference images not displayed]

FINDINGS: Frontal chest as well as oblique and cone-down rib images were
obtained. Lungs are clear. Heart size and pulmonary vascularity are
normal. No adenopathy. No pneumothorax or pleural effusion. No rib
fracture evident.
IMPRESSION: No rib fracture evident.  Lungs clear.

## 2018-05-30 MED ORDER — METHOCARBAMOL 750 MG PO TABS: 750.0000 mg | ORAL_TABLET | Freq: Two times a day (BID) | ORAL | 0 refills | Status: AC

## 2018-05-30 NOTE — ED Provider Notes (Signed)
[facility] EMERGENCY DEPARTMENT Provider Note   CSN: [id number] Arrival date & time: 05/30/18  1600    History   Chief Complaint Chief Complaint  Patient presents with  . Flank Pain    HPI TAARIQ Sexton is a 38 y.o. male who presents with a 2-day history of right-sided rib pain and right flank pain with deep inspiration.  Patient is also had intermittent chest tightness.  He has noticed this on exertion.  He denies any significant shortness of breath.  He reports the symptoms started with chest tightness 2 days ago.  He had been hanging some curtains.  He reports he was using some force with a screwdriver.  He reports the pain is not really worse with movement or palpation.  It is only when he takes a deep breath.  He denies any significant chest pain.  He also denies any abdominal pain, nausea, vomiting, urinary symptoms.  No medications taken prior to arrival.  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  He reports he drove back and forth to [place] 2 days in a row last week.  He denies any recent surgeries, known cancer, new leg pain or swelling, history of blood clots.     HPI  Past Medical History:  Diagnosis Date  . Allergy     Patient Active Problem List   Diagnosis Date Noted  . 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 FACIAL COSMETIC SURGERY     as a child        Home Medications    Prior to Admission medications   Medication Sig Start Date End Date Taking? Authorizing Provider  baclofen (LIORESAL) 10 MG tablet Take 1 tablet (10 mg total) by mouth 3 (three) times daily. 02/28/18   Orland Mustard, MD  ELDERBERRY PO Take by mouth.    [provider]  methocarbamol (ROBAXIN) 750 MG tablet Take 1 tablet (750 mg total) by mouth 2 (two) times daily. 05/30/18   Emi Holes, PA-C  Multiple Vitamin (MULTIVITAMIN) tablet Take 1 tablet by mouth daily.    [provider]  ranitidine  (ZANTAC) 150 MG tablet TAKE 1 TABLET BY MOUTH TWICE A DAY Patient not taking: Reported on 09/17/2017 02/19/17   Helane Rima, DO    Family History Family History  Problem Relation Age of Onset  . Cancer Mother   . Breast cancer Mother   . Cancer Paternal Grandmother   . Lung cancer Paternal Grandmother   . Cancer Paternal Grandfather   . Lung cancer Paternal Grandfather     Social History Social History   Tobacco Use  . Smoking status: Former Smoker    Packs/day: 0.50    Years: 6.00    Pack years: 3.00    Types: Cigarettes    Last attempt to quit: 01/05/2006    Years since quitting: 12.4  . Smokeless tobacco: Never Used  Substance Use Topics  . Alcohol use: Yes    Alcohol/week: 3.0 standard drinks    Types: 1 Cans of beer, 2 Shots of liquor per week  . Drug use: No     Allergies   Bee venom   Review of Systems Review of Systems  Constitutional: Negative for chills and fever.  HENT: Negative for facial swelling and sore throat.   Respiratory: Positive for chest tightness. Negative for cough and shortness of breath.   Cardiovascular: Negative for chest pain.  Gastrointestinal: Negative  for abdominal pain, nausea and vomiting.  Genitourinary: Positive for flank pain. Negative for dysuria.  Musculoskeletal: Negative for back pain.  Skin: Negative for rash and wound.  Neurological: Negative for headaches.  Psychiatric/Behavioral: The patient is not nervous/anxious.      Physical Exam Updated Vital Signs BP (!) 146/81 (BP Location: Left Arm)   Pulse 84   Temp 98.8 F (37.1 C) (Oral)   Resp 16   Ht 6\' 3"  (1.905 m)   Wt 88.5 kg   SpO2 99%   BMI 24.37 kg/m   Physical Exam Vitals signs and nursing note reviewed.  Constitutional:      General: He is not in acute distress.    Appearance: He is well-developed. He is not diaphoretic.  HENT:     Head: Normocephalic and atraumatic.     Mouth/Throat:     Pharynx: No oropharyngeal exudate.  Eyes:     General:  No scleral icterus.       Right eye: No discharge.        Left eye: No discharge.     Conjunctiva/sclera: Conjunctivae normal.     Pupils: Pupils are equal, round, and reactive to light.  Neck:     Musculoskeletal: Normal range of motion and neck supple.     Thyroid: No thyromegaly.  Cardiovascular:     Rate and Rhythm: Normal rate and regular rhythm.     Heart sounds: Normal heart sounds. No murmur. No friction rub. No gallop.   Pulmonary:     Effort: Pulmonary effort is normal. No respiratory distress.     Breath sounds: Normal breath sounds. No stridor. No wheezing or rales.  Abdominal:     General: Bowel sounds are normal. There is no distension.     Palpations: Abdomen is soft.     Tenderness: There is no abdominal tenderness. There is no guarding or rebound.  Musculoskeletal:     Comments: Pain is not reproduced on palpation of the chest, right flank, or right-sided back worse midline spine  Lymphadenopathy:     Cervical: No cervical adenopathy.  Skin:    General: Skin is warm and dry.     Coloration: Skin is not pale.     Findings: No rash.     Comments: No rash noted to the flank, no tenderness with light touch  Neurological:     Mental Status: He is alert.     Coordination: Coordination normal.      ED Treatments / Results  Labs (all labs ordered are listed, but only abnormal results are displayed) Labs Reviewed  BASIC METABOLIC PANEL - Abnormal; Notable for the following components:      Result Value   Glucose, Bld 111 (*)    All other components within normal limits  CBC WITH DIFFERENTIAL/PLATELET  TROPONIN I  D-DIMER, QUANTITATIVE ([facility])    EKG None  Radiology Dg Ribs Unilateral W/chest Right  Result Date: 05/30/2018 CLINICAL DATA:  Pain EXAM: RIGHT RIBS AND CHEST - 3+ VIEW COMPARISON:  None. FINDINGS: Frontal chest as well as oblique and cone-down rib images were obtained. Lungs are clear. Heart size and pulmonary vascularity are normal. No  adenopathy. No pneumothorax or pleural effusion. No rib fracture evident. IMPRESSION: No rib fracture evident.  Lungs clear. Electronically Signed   By: Bretta BangWilliam  Woodruff III M.D.   On: 05/30/2018 17:17    Procedures Procedures (including critical care time)  Medications Ordered in ED Medications - No data to display  Initial Impression / Assessment and Plan / ED Course  I have reviewed the triage vital signs and the nursing notes.  Pertinent labs & imaging results that were available during my care of the patient were reviewed by me and considered in my medical decision making (see chart for details).        Patient presenting with vague chest tightness, right flank and rib pain after hanging curtains.  Labs are unremarkable including CBC, BMP, troponin, and d-dimer.  Chest x-ray with rib series is also negative.  Suspect probable musculoskeletal etiology.  Patient is description of the curtain hanging make sense that he may have strained his back and chest wall.  Will treat with NSAIDs, Robaxin, ice, heat.  Follow-up to PCP as needed.  Strict return precautions given including shortness of breath, worsening chest pain, or any other new or concerning symptoms.  He understands and agrees with plan.  Patient vital stable throughout ED course and discharged in satisfactory condition.  Final Clinical Impressions(s) / ED Diagnoses   Final diagnoses:  Right flank pain  Rib pain on right side    ED Discharge Orders         Ordered    methocarbamol (ROBAXIN) 750 MG tablet  2 times daily     05/30/18 [address] [place] 05/30/18 1832    Terrilee Files, MD 05/31/18 1152

## 2018-05-30 NOTE — ED Notes (Signed)
Patient verbalizes understanding of discharge instructions . Opportunity for questions and answers were provided . Armband removed by staff ,Pt discharged from ED. W/C  offered at D/C  and Declined W/C at D/C and was escorted to lobby by RN.  

## 2018-05-30 NOTE — Discharge Instructions (Signed)
Take Robaxin twice daily as prescribed, as needed for muscle pain or spasms.  Do not drive or operate machinery while taking this medication.  Take ibuprofen 600 mg every 6 hours as prescribed over-the-counter.  He can also alternate with Tylenol as prescribed over-the-counter.  Use ice and heat alternating 20 minutes on, 20 minutes off.  Please follow-up with your doctor if your symptoms are not improving.  Please return to the emergency department if you develop any new or worsening symptoms including shortness of breath, persistent fever of 100.4, severe chest or abdominal pain, or any other concerning symptoms.

## 2018-05-30 NOTE — ED Triage Notes (Addendum)
PT states R flank tightness. Pt denies any other symptoms.  States pain increases with inspiration.  Was hanging curtains.

## 2018-05-31 NOTE — Telephone Encounter (Signed)
Called pt and scheduled ED follow up for Thursday with Dr. Earlene Plater.

## 2018-06-02 ENCOUNTER — Encounter: Payer: Self-pay | Admitting: Family Medicine

## 2018-06-02 ENCOUNTER — Ambulatory Visit (INDEPENDENT_AMBULATORY_CARE_PROVIDER_SITE_OTHER): Payer: BLUE CROSS/BLUE SHIELD | Admitting: Family Medicine

## 2018-06-02 ENCOUNTER — Other Ambulatory Visit: Payer: Self-pay

## 2018-06-02 VITALS — Ht 75.0 in | Wt 195.0 lb

## 2018-06-02 DIAGNOSIS — S39012A Strain of muscle, fascia and tendon of lower back, initial encounter: Secondary | ICD-10-CM | POA: Diagnosis not present

## 2018-06-02 DIAGNOSIS — R0789 Other chest pain: Secondary | ICD-10-CM

## 2018-06-02 MED ORDER — BACLOFEN 20 MG PO TABS: 20.0000 mg | ORAL_TABLET | Freq: Three times a day (TID) | ORAL | 0 refills | Status: AC

## 2018-06-02 NOTE — Progress Notes (Signed)
Virtual Visit via Video   Due to the COVID-19 pandemic, this visit was completed with telemedicine (audio/video) technology to reduce patient and provider exposure as well as to preserve personal protective equipment.   I connected with Salvadore Oxford by a video enabled telemedicine application and verified that I am speaking with the correct person using two identifiers. Location patient: Home Location provider: [facility], Office Persons participating in the virtual visit: JOVONI WOOTTEN, Helane Rima, DO Barnie Mort, CMA acting as scribe for Dr. Helane Rima.   I discussed the limitations of evaluation and management by telemedicine and the availability of in person appointments. The patient expressed understanding and agreed to proceed.  Care Team   Patient Care Team: Helane Rima, DO as PCP - General (Family Medicine)  Subjective:   HPI: Patient was seen in ED for chest pressure/tightness with deep breathing. He felt like pulled muscle has history of this in the past. Has also had some low back pain, felt like a strain. Had been changing curtains the day that symptoms began. ED evaluation reviewed - normal.   Review of Systems  Constitutional: Negative for chills and fever.  HENT: Negative for hearing loss and tinnitus.   Eyes: Negative for blurred vision and double vision.  Respiratory: Negative for cough and hemoptysis.   Cardiovascular: Positive for chest pain. Negative for palpitations.  Gastrointestinal: Negative for nausea and vomiting.  Genitourinary: Negative for dysuria and frequency.  Musculoskeletal: Negative for myalgias and neck pain.  Skin: Negative for rash.  Neurological: Negative for dizziness and headaches.  Psychiatric/Behavioral: Negative for depression.     Patient Active Problem List   Diagnosis Date Noted  . 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 Smoking status: Former Smoker    Packs/day: 0.50   Years: 6.00    Pack years: 3.00    Types: Cigarettes    Last attempt to quit: 01/05/2006    Years since quitting: 12.4  . Smokeless tobacco: Never Used  Substance Use Topics  . Alcohol use: Yes    Alcohol/week: 3.0 standard drinks    Types: 1 Cans of beer, 2 Shots of liquor per week   Current Outpatient Medications:  .  methocarbamol (ROBAXIN) 750 MG tablet, Take 1 tablet (750 mg total) by mouth 2 (two) times daily., Disp: 20 tablet, Rfl: 0 .  Multiple Vitamin (MULTIVITAMIN) tablet, Take 1 tablet by mouth daily., Disp: , Rfl:   Allergies  Allergen Reactions  . Bee Venom Hives and Swelling    Pt has been prescribed epi pens for this.    Objective:   VITALS: Per patient if applicable, see vitals. GENERAL: Alert, appears well and in no acute distress. HEENT: Atraumatic, conjunctiva clear, no obvious abnormalities on inspection of external nose and ears. NECK: Normal movements of the head and neck. CARDIOPULMONARY: No increased WOB. Speaking in clear sentences. I:E ratio WNL.  MS: Moves all visible extremities without noticeable abnormality. PSYCH: Pleasant and cooperative, well-groomed. Speech normal rate and rhythm. Affect is appropriate. Insight and judgement are appropriate. Attention is focused, linear, and appropriate.  NEURO: CN grossly intact. Oriented as arrived to appointment on time with no prompting. Moves both UE equally.  SKIN: No obvious lesions, wounds, erythema, or cyanosis noted on face or hands.  Depression screen [facility] 2/9 06/02/2018 03/19/2016  Decreased Interest 0 0  Down, Depressed, Hopeless 0 0  PHQ - 2 Score 0 0  Altered sleeping 0 -  Tired,  decreased energy 0 -  Change in appetite 0 -  Feeling bad or failure about yourself  0 -  Trouble concentrating 0 -  Moving slowly or fidgety/restless 0 -  Suicidal thoughts 0 -  PHQ-9 Score 0 -  Difficult doing work/chores Not difficult at all -    Assessment and Plan:   Sheria LangCameron was seen today for  follow-up.  Diagnoses and all orders for this visit:  Strain of lumbar region, initial encounter -     baclofen (LIORESAL) 20 MG tablet; Take 1 tablet (20 mg total) by mouth 3 (three) times daily.  Chest wall discomfort    . COVID-19 Education: The signs and symptoms of COVID-19 were discussed with the patient and how to seek care for testing if needed. The importance of social distancing was discussed today. . Reviewed expectations re: course of current medical issues. . Discussed self-management of symptoms. . Outlined signs and symptoms indicating need for more acute intervention. . Patient verbalized understanding and all questions were answered. Marland Kitchen. Health Maintenance issues including appropriate healthy diet, exercise, and smoking avoidance were discussed with patient. . See orders for this visit as documented in the electronic medical record.  Helane RimaErica Siara Gorder, DO  Records requested if needed. Time spent: 25 minutes, of which >50% was spent in obtaining information about his symptoms, reviewing his previous labs, evaluations, and treatments, counseling him about his condition (please see the discussed topics above), and developing a plan to further investigate it; he had a number of questions which I addressed.

## 2018-06-02 NOTE — Patient Instructions (Signed)

## 2018-06-03 ENCOUNTER — Encounter: Payer: Self-pay | Admitting: Family Medicine

## 2018-06-03 ENCOUNTER — Telehealth: Payer: Self-pay

## 2018-06-03 DIAGNOSIS — Z13220 Encounter for screening for lipoid disorders: Secondary | ICD-10-CM

## 2018-06-03 NOTE — Telephone Encounter (Signed)
Have message to call for lab appointment do not see any orders in.

## 2018-06-05 NOTE — Telephone Encounter (Signed)
Orders placed.

## 2018-06-05 NOTE — Addendum Note (Signed)
Addended by: Helane Rima R on: 06/05/2018 10:28 AM   Modules accepted: Orders

## 2018-06-21 ENCOUNTER — Other Ambulatory Visit: Payer: Self-pay | Admitting: Family Medicine

## 2018-06-21 NOTE — Telephone Encounter (Signed)
Dr.Wallace patient 

## 2019-04-06 ENCOUNTER — Ambulatory Visit: Payer: BC Managed Care – PPO | Attending: Internal Medicine

## 2019-04-06 DIAGNOSIS — Z23 Encounter for immunization: Secondary | ICD-10-CM

## 2019-04-06 NOTE — Progress Notes (Signed)
   Covid-19 Vaccination Clinic  Name:  Christopher Sexton    MRN: [id number] DOB: [birth year]  04/06/2019  Christopher Sexton was observed post Covid-19 immunization for 30 minutes based on pre-vaccination screening without incident. He was provided with Vaccine Information Sheet and instruction to access the V-Safe system.   Christopher Sexton was instructed to call 911 with any severe reactions post vaccine: Marland Kitchen Difficulty breathing  . Swelling of face and throat  . A fast heartbeat  . A bad rash all over body  . Dizziness and weakness   Immunizations Administered    Name Date Dose VIS Date Route   Pfizer COVID-19 Vaccine 04/06/2019  1:51 PM 0.3 mL 12/16/2018 Intramuscular   Manufacturer: [employer], [employer]   Lot: [id number]   NDC: [address]-4    pt reported no issues

## 2019-05-01 ENCOUNTER — Ambulatory Visit: Payer: BC Managed Care – PPO | Attending: Internal Medicine

## 2019-05-01 DIAGNOSIS — Z23 Encounter for immunization: Secondary | ICD-10-CM

## 2019-05-01 NOTE — Progress Notes (Signed)
   Covid-19 Vaccination Clinic  Name:  SANDEEP RADELL    MRN: [id number] DOB: 1980/07/31  05/01/2019  Mr. Bilton was observed post Covid-19 immunization for 15 minutes without incident. He was provided with Vaccine Information Sheet and instruction to access the V-Safe system.   Mr. Oravec was instructed to call 911 with any severe reactions post vaccine: Marland Kitchen Difficulty breathing  . Swelling of face and throat  . A fast heartbeat  . A bad rash all over body  . Dizziness and weakness   Immunizations Administered    Name Date Dose VIS Date Route   Pfizer COVID-19 Vaccine 05/01/2019  9:59 AM 0.3 mL 03/01/2018 Intramuscular   Manufacturer: [employer], [employer]   Lot: [id number]   NDC: [address]-0

## 2019-10-09 ENCOUNTER — Encounter: Payer: Self-pay | Admitting: Family Medicine

## 2019-10-13 ENCOUNTER — Encounter: Payer: Self-pay | Admitting: Family Medicine

## 2019-10-13 ENCOUNTER — Ambulatory Visit: Payer: BC Managed Care – PPO | Admitting: Family Medicine

## 2019-10-13 ENCOUNTER — Other Ambulatory Visit: Payer: Self-pay

## 2019-10-13 VITALS — BP 128/89 | HR 65 | Temp 97.7°F | Ht 75.0 in | Wt 203.0 lb

## 2019-10-13 DIAGNOSIS — G4484 Primary exertional headache: Secondary | ICD-10-CM | POA: Diagnosis not present

## 2019-10-13 NOTE — Patient Instructions (Signed)
MRI/MRA of head has been ordered. No contrast needed. I did stat, so hopefully we can have this done SOON! They will call you to set up!  Thanks again for coming in! Dr. Artis Flock

## 2019-10-13 NOTE — Progress Notes (Signed)
Patient: Christopher Sexton MRN: [id number] DOB: 1980/05/09 PCP: No primary care provider on file.     Subjective:  Chief Complaint  Patient presents with  . Headache    HPI: The patient is a 39 y.o. male who presents today for headache. Pt says that 2 weeks ago, he had a headache while working out (high intensity) with tingling and fogginess. The next day he was out of it. Pt also had dental work done, so he thinks it could have possibly been from that. He says that since then he has been very foggy. He states it happened another time during leg day and it went away. He didn't feel anything after this. He denies any focal deficits. He has no co morbidities. He has history of maternal aunt who had an aneurysm.   No other symptoms., but continues to be a little foggy. He has continued to work out and had no headaches this week. He didn't push himself hard with this.   Review of Systems  Respiratory: Negative for shortness of breath.   Neurological: Negative for dizziness, light-headedness and headaches.  Psychiatric/Behavioral: Negative for confusion.    Allergies Patient is allergic to bee venom.  Past Medical History Patient  has a past medical history of Allergy.  Surgical History Patient  has a past surgical history that includes Facial cosmetic surgery.  Family History Pateint's family history includes Breast cancer in his mother; Cancer in his mother, paternal grandfather, and paternal grandmother; Lung cancer in his paternal grandfather and paternal grandmother.  Social History Patient  reports that he quit smoking about 13 years ago. His smoking use included cigarettes. He has a 3.00 pack-year smoking history. He has never used smokeless tobacco. He reports current alcohol use of about 3.0 standard drinks of alcohol per week. He reports that he does not use drugs.    Objective: Vitals:   10/13/19 1128 10/13/19 1159  BP: (!) 141/89 128/89  Pulse: 65   Temp: 97.7 F  (36.5 C)   TempSrc: Temporal   SpO2: 99%   Weight: 203 lb (92.1 kg)   Height: 6\' 3"  (1.905 m)     Body mass index is 25.37 kg/m.  Physical Exam Vitals reviewed.  Constitutional:      Appearance: He is well-developed and normal weight.  HENT:     Head: Normocephalic and atraumatic.  Eyes:     General: No visual field deficit.    Extraocular Movements: Extraocular movements intact.     Pupils: Pupils are equal, round, and reactive to light.  Cardiovascular:     Rate and Rhythm: Normal rate and regular rhythm.     Heart sounds: Normal heart sounds.  Pulmonary:     Effort: Pulmonary effort is normal.     Breath sounds: Normal breath sounds.  Abdominal:     General: Bowel sounds are normal.     Palpations: Abdomen is soft.  Musculoskeletal:     Cervical back: Normal range of motion and neck supple.  Skin:    General: Skin is warm.     Findings: No rash.  Neurological:     Mental Status: He is alert and oriented to person, place, and time.     Cranial Nerves: No cranial nerve deficit, dysarthria or facial asymmetry.     Sensory: No sensory deficit.     Motor: No weakness.     Deep Tendon Reflexes: Reflexes normal.  Psychiatric:        Mood and Affect: Mood normal.  Speech: Speech normal.        Behavior: Behavior normal.        Assessment/plan: 1. Exertional headache Stat MRI/MRA. Do not want him do heavy exercise until this is done. precautions given.  - MR Brain Wo Contrast; Future - MR Angiogram Head Wo Contrast; Future  Spent 25  Minutes doing peer to peer to get MRA approved.   This visit occurred during the SARS-CoV-2 public health emergency.  Safety protocols were in place, including screening questions prior to the visit, additional usage of staff PPE, and extensive cleaning of exam room while observing appropriate contact time as indicated for disinfecting solutions.     Return if symptoms worsen or fail to improve.   Orland Mustard, MD [place]  [facility]   10/13/2019

## 2019-10-16 ENCOUNTER — Other Ambulatory Visit: Payer: Self-pay

## 2019-10-16 ENCOUNTER — Ambulatory Visit (HOSPITAL_COMMUNITY)
Admission: RE | Admit: 2019-10-16 | Discharge: 2019-10-16 | Disposition: A | Discharge status: Home / Self Care | Payer: BC Managed Care – PPO | Source: Ambulatory Visit | Attending: Family Medicine | Admitting: Family Medicine

## 2019-10-16 ENCOUNTER — Encounter: Payer: Self-pay | Admitting: Family Medicine

## 2019-10-16 ENCOUNTER — Ambulatory Visit: Payer: BC Managed Care – PPO | Admitting: Family Medicine

## 2019-10-16 DIAGNOSIS — G4484 Primary exertional headache: Secondary | ICD-10-CM | POA: Insufficient documentation

## 2019-10-16 DIAGNOSIS — R519 Headache, unspecified: Secondary | ICD-10-CM | POA: Diagnosis not present

## 2019-10-16 IMAGING — MR MR HEAD W/O CM
12 of 13 series · 44 of 48 positions shown · non-contrast
Comparison: None.

CLINICAL DATA: New or worsening headache following exertion.

EXAM:
MRI HEAD WITHOUT CONTRAST
MRA HEAD WITHOUT CONTRAST
TECHNIQUE: Multiplanar, multiecho pulse sequences of the brain and surrounding
structures were obtained without intravenous contrast. Angiographic
images of the head were obtained using MRA technique without
contrast.

[Series 5: DWI · axial · 3.0mm · 0.88mm/px · z∈[-22,+118]mm · 8 of 96 slices shown (1 of 4)]
[im 1/96]
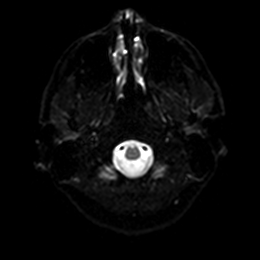
[im 14/96]
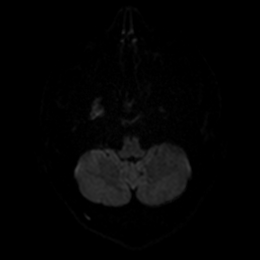
[im 28/96]
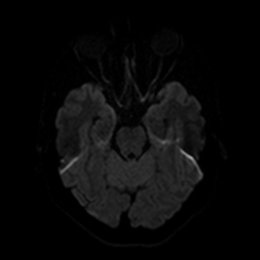
[im 41/96]
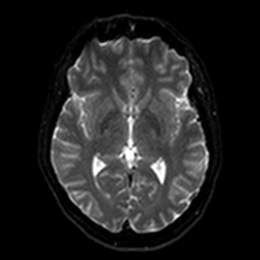
[im 55/96]
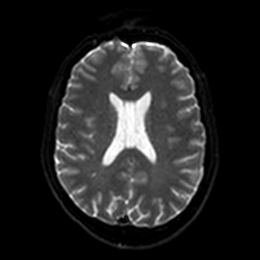
[im 68/96]
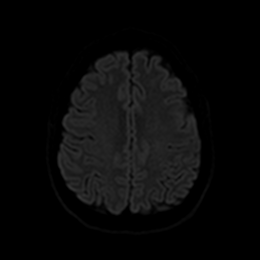
[im 82/96]
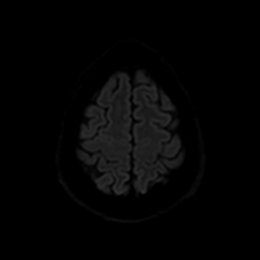
[im 96/96]
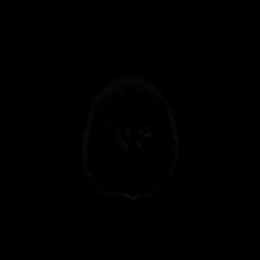

[Series 6: DWI · axial · 3.0mm · 0.88mm/px · z∈[-22,+118]mm · 4 of 48 slices shown (2 of 4)]
[im 1/48]
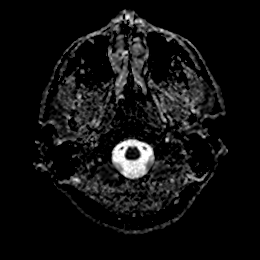
[im 16/48]
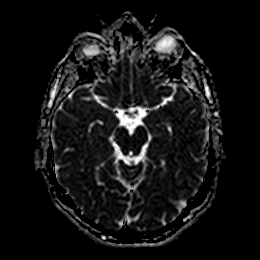
[im 32/48]
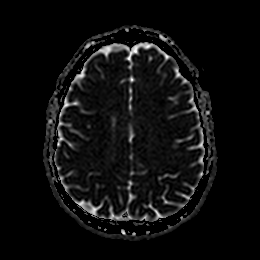
[im 48/48]
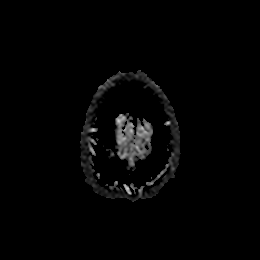

[Series 7: DWI · coronal · 4.0mm · 0.88mm/px · 5 of 70 slices shown (3 of 4)]
[im 1/70]
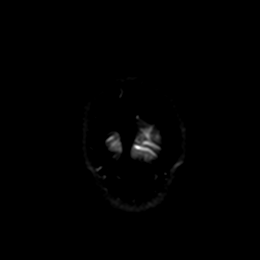
[im 18/70]
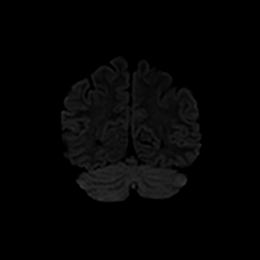
[im 35/70]
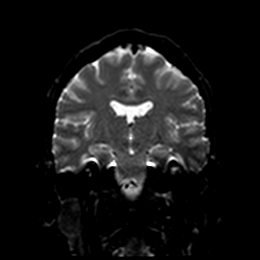
[im 52/70]
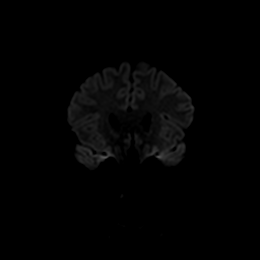
[im 70/70]
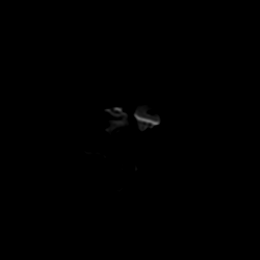

[Series 8: DWI · coronal · 4.0mm · 0.88mm/px · 3 of 35 slices shown (4 of 4)]
[im 1/35]
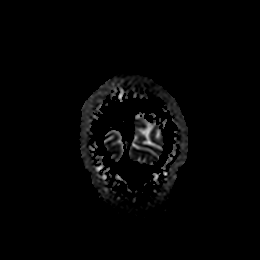
[im 18/35]
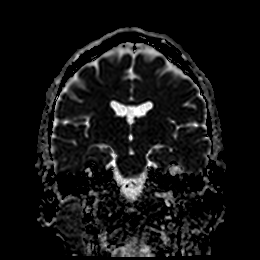
[im 35/35]
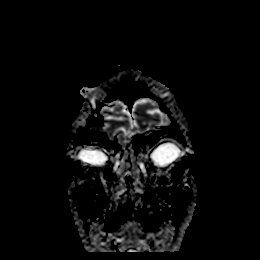

[Series 13: T1 · sagittal · 5.0mm · 0.75mm/px · 2 of 25 slices shown]
[im 1/25]
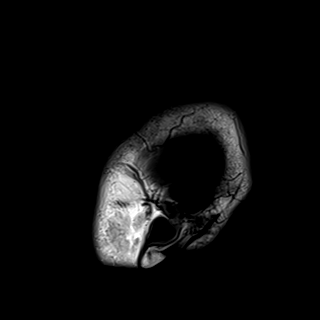
[im 25/25]
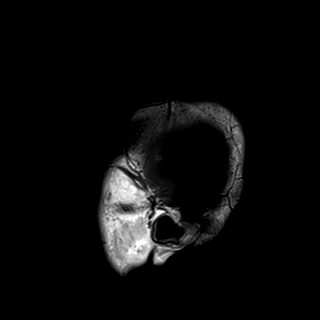

[Series 14: T2 · axial · 5.0mm · 0.72mm/px · z∈[-24,+119]mm · 2 of 25 slices shown (1 of 2)]
[im 1/25]
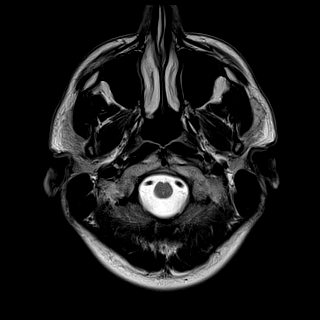
[im 25/25]
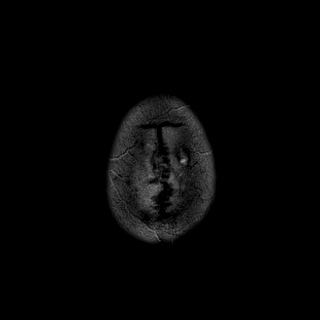

[Series 15: FLAIR · axial · 5.0mm · 0.45mm/px · z∈[-25,+118]mm · 2 of 25 slices shown]
[im 1/25]
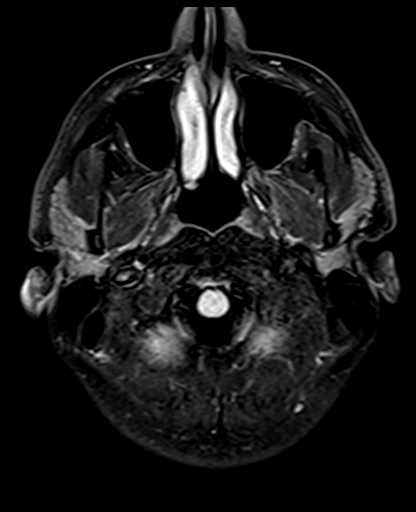
[im 25/25]
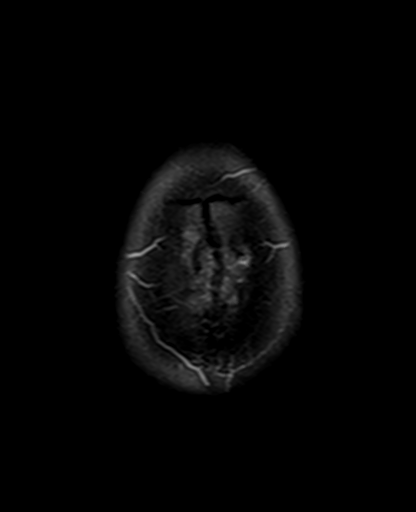

[Series 16: mag_images · axial · 3.0mm · 0.90mm/px · z∈[-41,+135]mm · 4 of 60 slices shown]
[im 1/60]
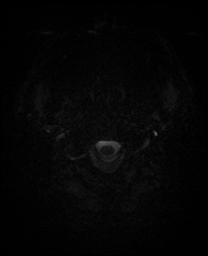
[im 20/60]
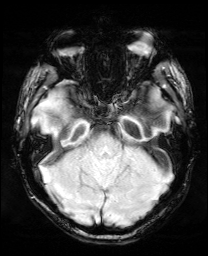
[im 40/60]
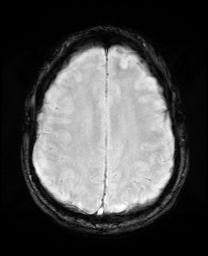
[im 60/60]
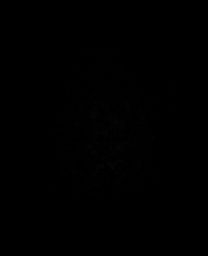

[Series 17: pha_images · axial · 3.0mm · 0.90mm/px · z∈[-41,+135]mm · 4 of 60 slices shown]
[im 1/60]
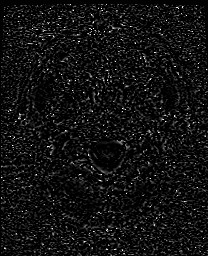
[im 20/60]
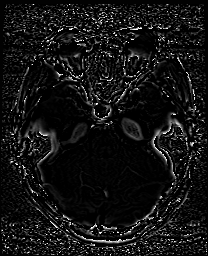
[im 40/60]
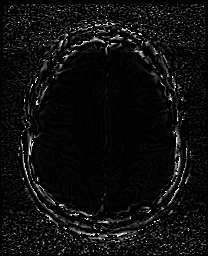
[im 60/60]
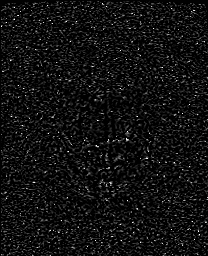

[Series 18: swi_images · axial · 3.0mm · 0.90mm/px · z∈[-41,+135]mm · 4 of 60 slices shown]
[im 1/60]
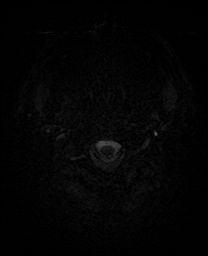
[im 20/60]
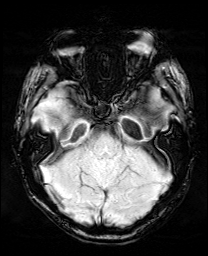
[im 40/60]
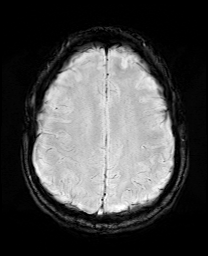
[im 60/60]
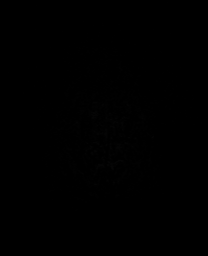

[Series 19: mip_images(sw) · axial · 24.0mm · 0.90mm/px · z∈[-30,+124]mm · 4 of 53 slices shown]
[im 1/53]
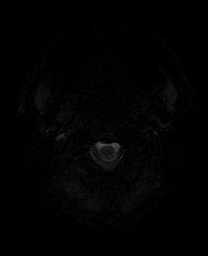
[im 18/53]
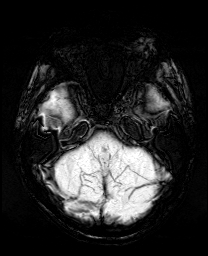
[im 35/53]
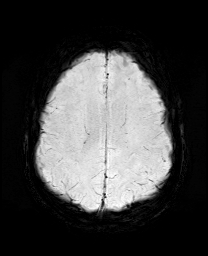
[im 53/53]
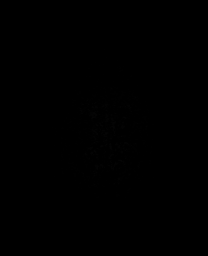

[Series 21: T2 · coronal · 5.0mm · 0.34mm/px · 2 of 30 slices shown (2 of 2)]
[im 1/30]
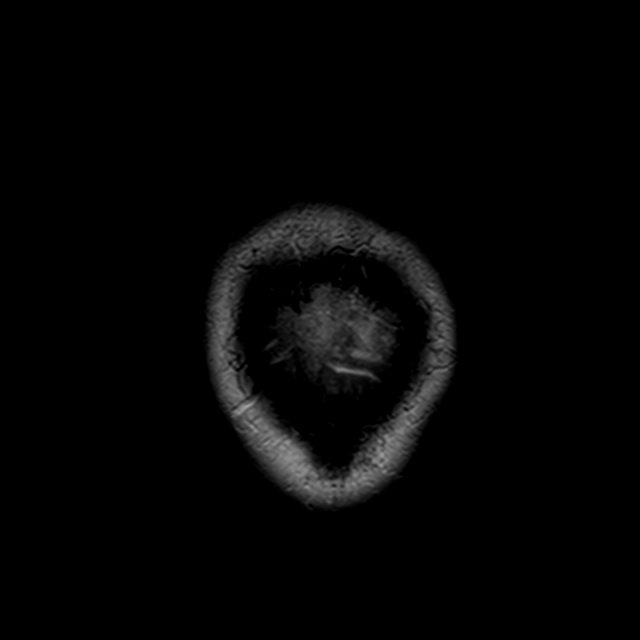
[im 30/30]
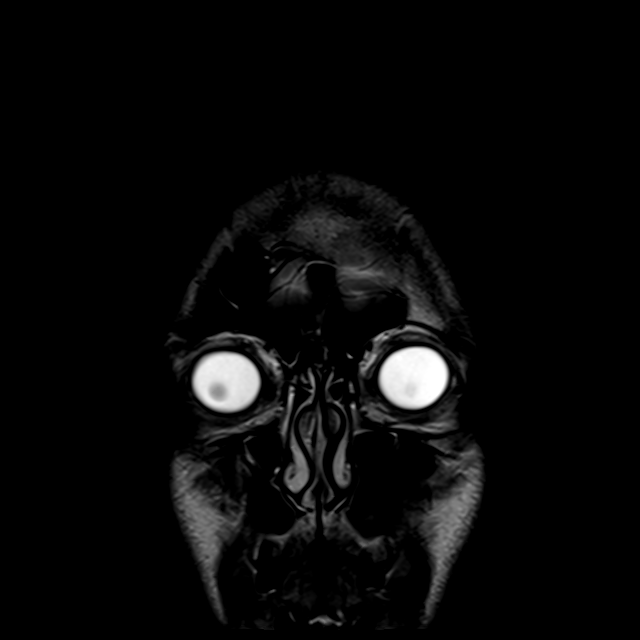

[44 of 48 positions shown; findings below may reference images not displayed]

FINDINGS: MRI HEAD FINDINGS

Brain: The brain has a normal appearance without evidence of
malformation, atrophy, old or acute small or large vessel
infarction, mass lesion, hemorrhage, hydrocephalus or extra-axial
collection.

Vascular: Major vessels at the base of the brain show flow. Venous
sinuses appear patent.

Skull and upper cervical spine: Normal.

Sinuses/Orbits: Clear/normal.

Other: None significant.

MRA HEAD FINDINGS

Both internal carotid arteries are widely patent into the brain. No
siphon stenosis. The anterior and middle cerebral vessels are patent
without proximal stenosis, aneurysm or vascular malformation.

Both vertebral arteries are widely patent to the basilar. No basilar
stenosis. Posterior circulation branch vessels appear normal.
IMPRESSION: 1. Normal MRI of the brain.
2. Normal intracranial MR angiography. No aneurysm or vascular
malformation.

## 2019-10-16 IMAGING — MR MR MRA HEAD W/O CM
1 series · 19 of 48 positions shown · non-contrast
Comparison: None.

CLINICAL DATA: New or worsening headache following exertion.

EXAM:
MRI HEAD WITHOUT CONTRAST
MRA HEAD WITHOUT CONTRAST
TECHNIQUE: Multiplanar, multiecho pulse sequences of the brain and surrounding
structures were obtained without intravenous contrast. Angiographic
images of the head were obtained using MRA technique without
contrast.

[Series 9: 3d cow · axial · 0.5mm · 0.41mm/px · z∈[-20,+60]mm · 19 of 172 slices shown]
[im 1/172]
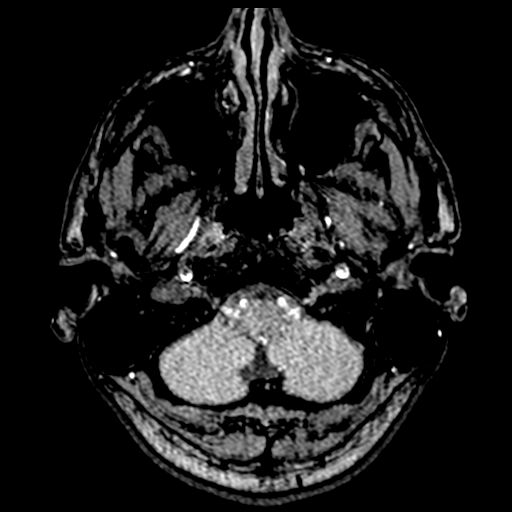
[im 4/172]
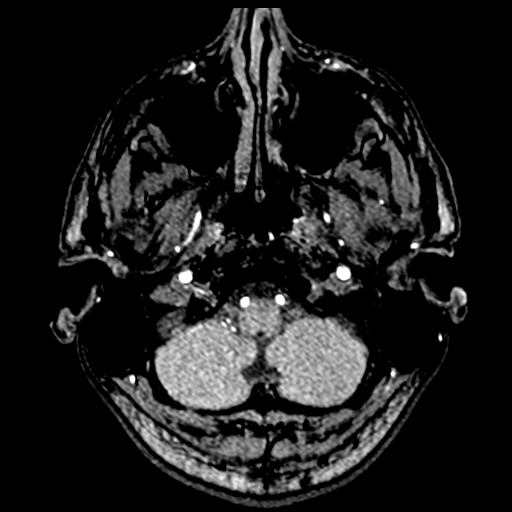
[im 8/172]
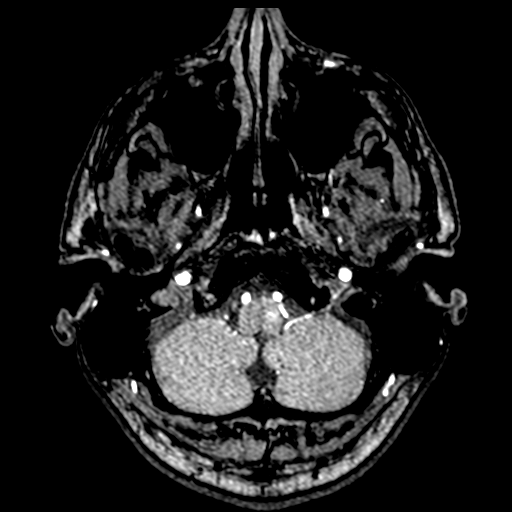
[im 11/172]
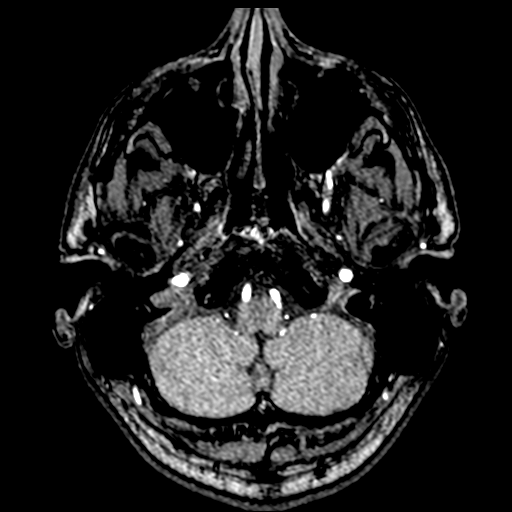
[im 15/172]
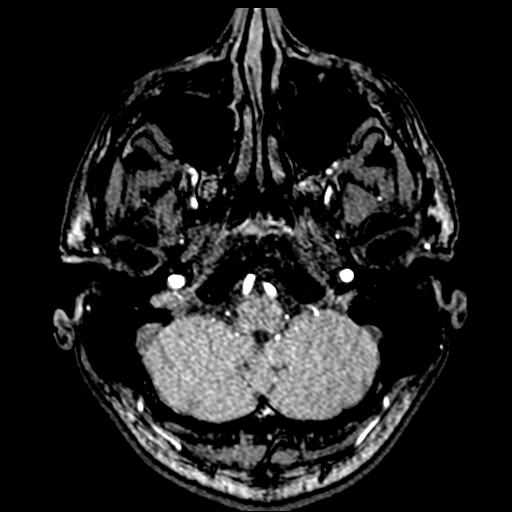
[im 19/172]
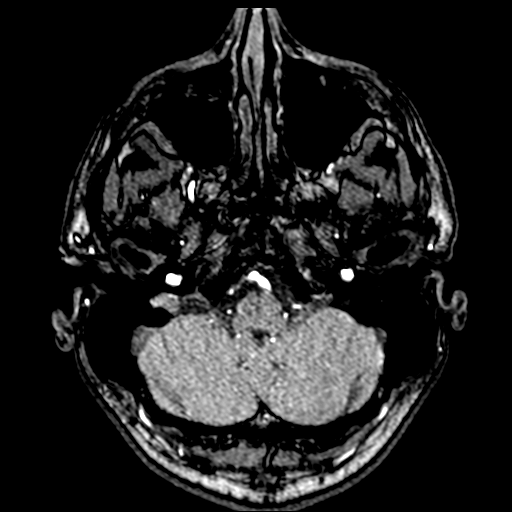
[im 22/172]
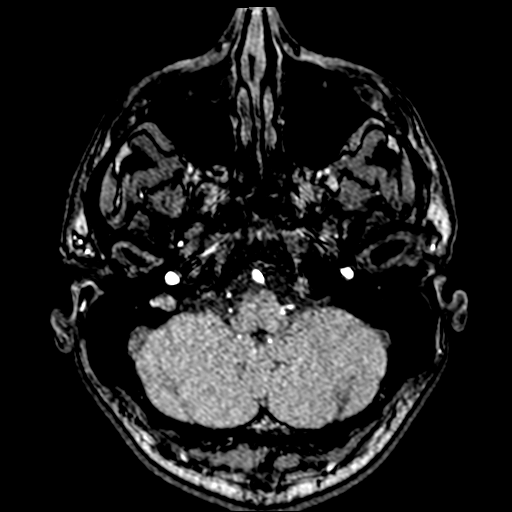
[im 26/172]
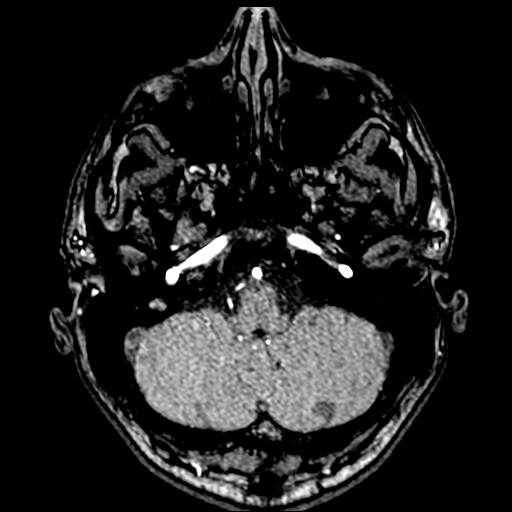
[im 30/172]
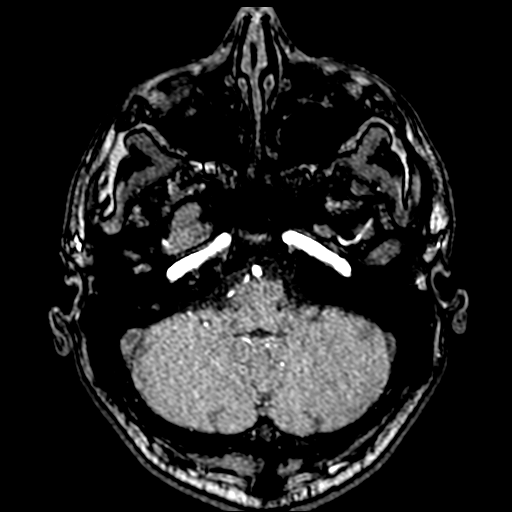
[im 33/172]
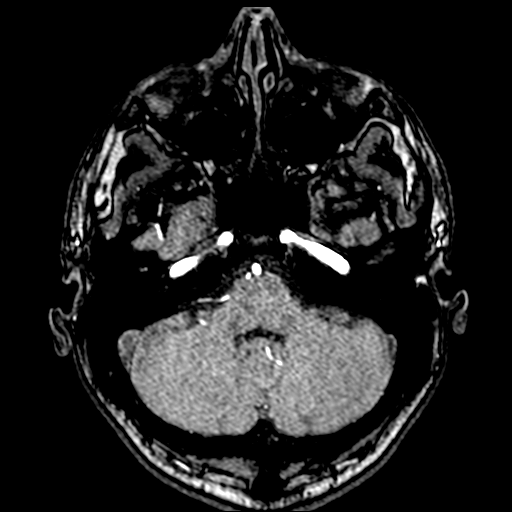
[im 37/172]
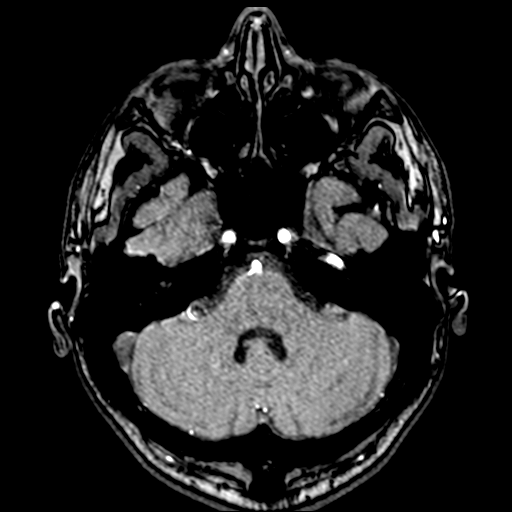
[im 55/172]
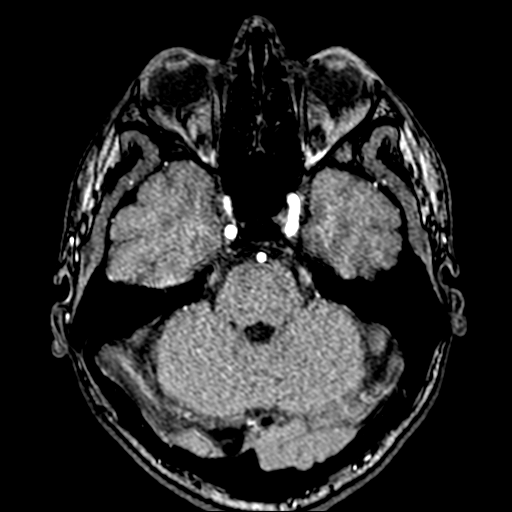
[im 77/172]
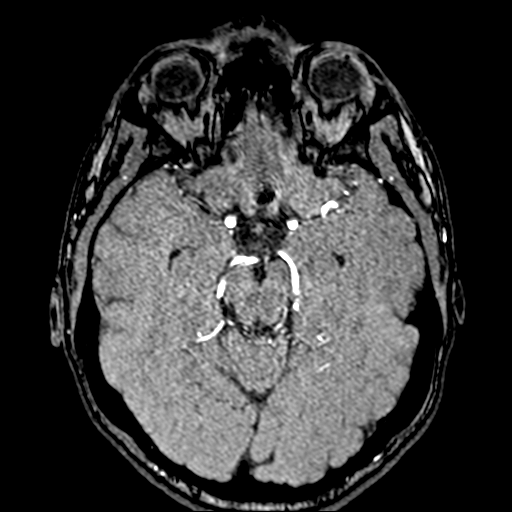
[im 88/172]
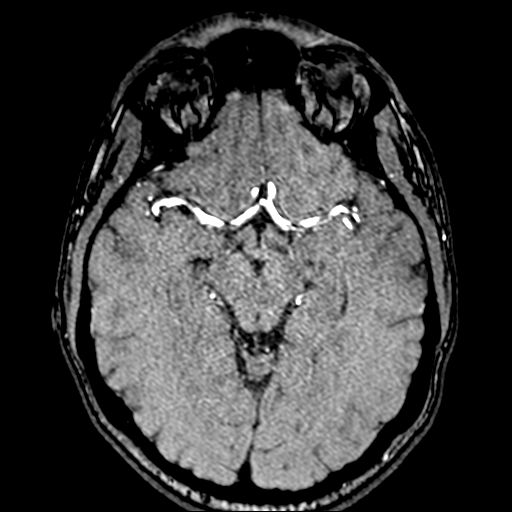
[im 99/172]
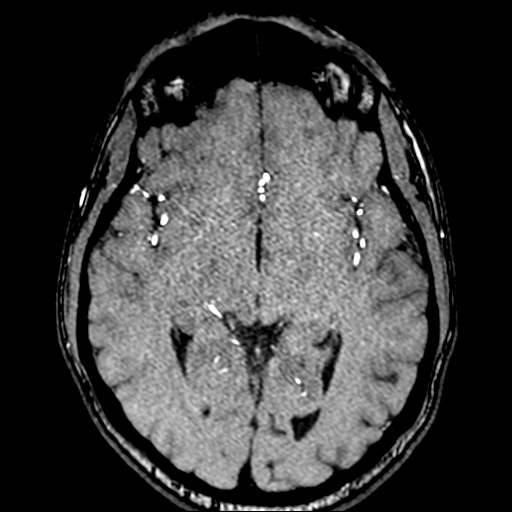
[im 121/172]
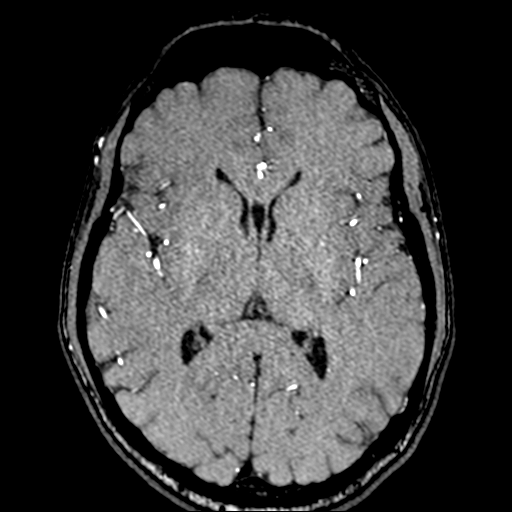
[im 142/172]
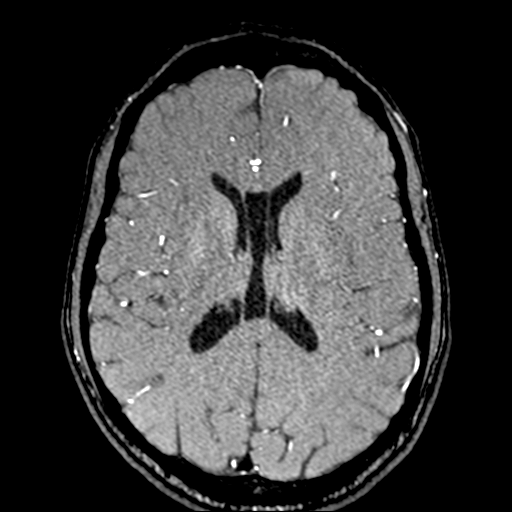
[im 146/172]
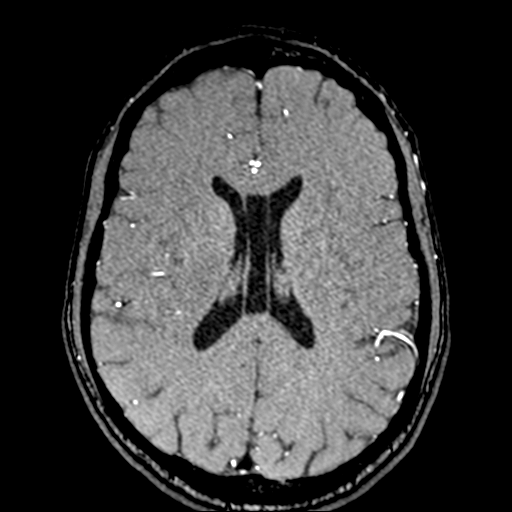
[im 164/172]
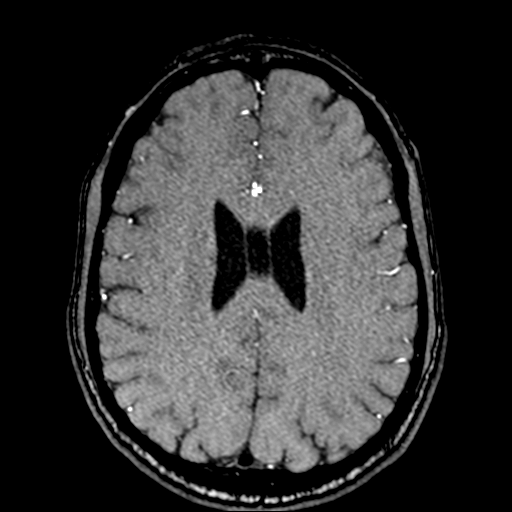

[19 of 48 positions shown; findings below may reference images not displayed]

FINDINGS: MRI HEAD FINDINGS

Brain: The brain has a normal appearance without evidence of
malformation, atrophy, old or acute small or large vessel
infarction, mass lesion, hemorrhage, hydrocephalus or extra-axial
collection.

Vascular: Major vessels at the base of the brain show flow. Venous
sinuses appear patent.

Skull and upper cervical spine: Normal.

Sinuses/Orbits: Clear/normal.

Other: None significant.

MRA HEAD FINDINGS

Both internal carotid arteries are widely patent into the brain. No
siphon stenosis. The anterior and middle cerebral vessels are patent
without proximal stenosis, aneurysm or vascular malformation.

Both vertebral arteries are widely patent to the basilar. No basilar
stenosis. Posterior circulation branch vessels appear normal.
IMPRESSION: 1. Normal MRI of the brain.
2. Normal intracranial MR angiography. No aneurysm or vascular
malformation.

## 2019-10-27 ENCOUNTER — Encounter: Payer: BC Managed Care – PPO | Admitting: Family Medicine

## 2019-12-05 ENCOUNTER — Other Ambulatory Visit (HOSPITAL_BASED_OUTPATIENT_CLINIC_OR_DEPARTMENT_OTHER): Payer: Self-pay | Admitting: Internal Medicine

## 2019-12-05 ENCOUNTER — Ambulatory Visit: Payer: BC Managed Care – PPO | Attending: Internal Medicine

## 2019-12-05 DIAGNOSIS — Z23 Encounter for immunization: Secondary | ICD-10-CM

## 2019-12-05 MED FILL — PFIZER-BIONTECH COVID-19 VA: 30 | 1 days supply | Qty: 0 | Fill #0

## 2019-12-05 NOTE — Progress Notes (Signed)
   Covid-19 Vaccination Clinic  Name:  Christopher Sexton    MRN: [id number] DOB: 24-Aug-1980  12/05/2019  [name] was observed post Covid-19 immunization for 15 minutes without incident. He was provided with Vaccine Information Sheet and instruction to access the V-Safe system.   [name] was instructed to call 911 with any severe reactions post vaccine: Marland Kitchen Difficulty breathing  . Swelling of face and throat  . A fast heartbeat  . A bad rash all over body  . Dizziness and weakness   Immunizations Administered    Name Date Dose VIS Date Route   Pfizer COVID-19 Vaccine 12/05/2019 12:36 PM 0.3 mL 10/25/2019 Intramuscular   Manufacturer: [employer], [employer]   Lot: [id number]   NDC: [address]-1

## 2019-12-12 ENCOUNTER — Ambulatory Visit: Payer: BC Managed Care – PPO

## 2020-02-09 ENCOUNTER — Ambulatory Visit: Payer: BC Managed Care – PPO | Admitting: Family Medicine

## 2020-02-09 ENCOUNTER — Encounter: Payer: Self-pay | Admitting: Family Medicine

## 2020-02-09 ENCOUNTER — Other Ambulatory Visit: Payer: Self-pay

## 2020-02-09 VITALS — BP 112/70 | HR 61 | Temp 96.9°F | Ht 75.0 in | Wt 205.8 lb

## 2020-02-09 DIAGNOSIS — K644 Residual hemorrhoidal skin tags: Secondary | ICD-10-CM

## 2020-02-09 MED ORDER — HYDROCORTISONE (PERIANAL) 2.5 % EX CREA: 1.0000 "application " | TOPICAL_CREAM | Freq: Two times a day (BID) | CUTANEOUS | 1 refills | Status: AC

## 2020-02-09 NOTE — Progress Notes (Signed)
Patient: Christopher Sexton MRN: [id number] DOB: 08-19-1980 PCP: Orland Mustard, MD     Subjective:  Chief Complaint  Patient presents with  . Hemorrhoids    HPI: The patient is a 40 y.o. male who presents today for possible hemorrhoid. He noticed Saturday or Sunday morning when he woke up. He feels a bulge in his bottom area. He has had this happen before. He has used vaseline. He says that he has been eating poorly, and not been in the gym lately. He was golfing the prior 3 days before he felt this sensation. He also had been on an airplane traveling.  He has no rectal bleeding, just discomfort. He has no itching. He denies any blood in his stool. He does state it's hard to have a BM due to possible hemorrhoid. 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   NO family hx of UC/chron's or colon cancer.    Review of Systems  Constitutional: Negative for chills and fever.  Gastrointestinal: Positive for rectal pain.    Allergies Patient is allergic to bee venom.  Past Medical History Patient  has a past medical history of Allergy.  Surgical History Patient  has a past surgical history that includes Facial cosmetic surgery.  Family History Pateint's family history includes Breast cancer in his mother; Cancer in his mother, paternal grandfather, and paternal grandmother; Lung cancer in his paternal grandfather and paternal grandmother.  Social History Patient  reports that he quit smoking about 14 years ago. His smoking use included cigarettes. He has a 3.00 pack-year smoking history. He has never used smokeless tobacco. He reports current alcohol use of about 3.0 standard drinks of alcohol per week. He reports that he does not use drugs.    Objective: Vitals:   02/09/20 0821  BP: 112/70  Pulse: 61  Temp: (!) 96.9 F (36.1 C)  TempSrc: Temporal  SpO2: 98%  Weight: 205 lb 12.8 oz (93.4 kg)  Height: 6\' 3"  (1.905 m)    Body mass index is 25.72 kg/m.  Physical Exam Vitals reviewed.  Exam conducted with a chaperone present.  Constitutional:      Appearance: Normal appearance. He is normal weight.  HENT:     Head: Normocephalic and atraumatic.  Pulmonary:     Effort: Pulmonary effort is normal.  Genitourinary:    Comments: External hemorrhoid at 5:00 position. No thrombosis. No bleeding  Neurological:     Mental Status: He is alert.        Assessment/plan: 1. External hemorrhoid Discussed behavioral modifications to implement -high fiber diet and can also get fiber supplement to put into drink -exercise -not straining or sitting for long periods of time -warm sitz baths if needed -colace prn to keep stool soft -anusol BID also sent in  If not getting better he is to let me know so we can send to colorectal for banding, but hopefully this will decrease in size with above mentioned modifications.      This visit occurred during the SARS-CoV-2 public health emergency.  Safety protocols were in place, including screening questions prior to the visit, additional usage of staff PPE, and extensive cleaning of exam room while observing appropriate contact time as indicated for disinfecting solutions.     Return if symptoms worsen or fail to improve.    , MD [place] [facility]   02/09/2020

## 2020-02-09 NOTE — Patient Instructions (Signed)
-can get colace (docusate sodium) over the counter to help keep stool soft. May try daily for a week or so -increase fiber if can't get with food try metamucil, benefiber, konsyl and add to your water/drinks.  -warm sitz baths  -sent in anusol cream to put on it twice a day. It's a steroid cream to help inflammation/pain. If this is too expensive, get preparation H over the counter.   If not getting better let me know and we can send to colorectal to have them band it and remove it.   Hemorrhoids Hemorrhoids are swollen veins that may develop:  In the butt (rectum). These are called internal hemorrhoids.  Around the opening of the butt (anus). These are called external hemorrhoids. Hemorrhoids can cause pain, itching, or bleeding. Most of the time, they do not cause serious problems. They usually get better with diet changes, lifestyle changes, and other home treatments. What are the causes? This condition may be caused by:  Having trouble pooping (constipation).  Pushing hard (straining) to poop.  Watery poop (diarrhea).  Pregnancy.  Being very overweight (obese).  Sitting for long periods of time.  Heavy lifting or other activity that causes you to strain.  Anal sex.  Riding a bike for a long period of time. What are the signs or symptoms? Symptoms of this condition include:  Pain.  Itching or soreness in the butt.  Bleeding from the butt.  Leaking poop.  Swelling in the area.  One or more lumps around the opening of your butt. How is this diagnosed? A doctor can often diagnose this condition by looking at the affected area. The doctor may also:  Do an exam that involves feeling the area with a gloved hand (digital rectal exam).  Examine the area inside your butt using a small tube (anoscope).  Order blood tests. This may be done if you have lost a lot of blood.  Have you get a test that involves looking inside the colon using a flexible tube with a camera  on the end (sigmoidoscopy or colonoscopy). How is this treated? This condition can usually be treated at home. Your doctor may tell you to change what you eat, make lifestyle changes, or try home treatments. If these do not help, procedures can be done to remove the hemorrhoids or make them smaller. These may involve:  Placing rubber bands at the base of the hemorrhoids to cut off their blood supply.  Injecting medicine into the hemorrhoids to shrink them.  Shining a type of light energy onto the hemorrhoids to cause them to fall off.  Doing surgery to remove the hemorrhoids or cut off their blood supply. Follow these instructions at home: Eating and drinking  Eat foods that have a lot of fiber in them. These include whole grains, beans, nuts, fruits, and vegetables.  Ask your doctor about taking products that have added fiber (fibersupplements).  Reduce the amount of fat in your diet. You can do this by: ? Eating low-fat dairy products. ? Eating less red meat. ? Avoiding processed foods.  Drink enough fluid to keep your pee (urine) pale yellow.   Managing pain and swelling  Take a warm-water bath (sitz bath) for 20 minutes to ease pain. Do this 3-4 times a day. You may do this in a bathtub or using a portable sitz bath that fits over the toilet.  If told, put ice on the painful area. It may be helpful to use ice between your warm baths. ?  Put ice in a plastic bag. ? Place a towel between your skin and the bag. ? Leave the ice on for 20 minutes, 2-3 times a day.   General instructions  Take over-the-counter and prescription medicines only as told by your doctor. ? Medicated creams and medicines may be used as told.  Exercise often. Ask your doctor how much and what kind of exercise is best for you.  Go to the bathroom when you have the urge to poop. Do not wait.  Avoid pushing too hard when you poop.  Keep your butt dry and clean. Use wet toilet paper or moist towelettes  after pooping.  Do not sit on the toilet for a long time.  Keep all follow-up visits as told by your doctor. This is important. Contact a doctor if you:  Have pain and swelling that do not get better with treatment or medicine.  Have trouble pooping.  Cannot poop.  Have pain or swelling outside the area of the hemorrhoids. Get help right away if you have:  Bleeding that will not stop. Summary  Hemorrhoids are swollen veins in the butt or around the opening of the butt.  They can cause pain, itching, or bleeding.  Eat foods that have a lot of fiber in them. These include whole grains, beans, nuts, fruits, and vegetables.  Take a warm-water bath (sitz bath) for 20 minutes to ease pain. Do this 3-4 times a day. This information is not intended to replace advice given to you by your health care provider. Make sure you discuss any questions you have with your health care provider. Document Revised: 12/30/2017 Document Reviewed: 05/13/2017 Elsevier Patient Education  2021 [employer].

## 2023-01-07 ENCOUNTER — Other Ambulatory Visit (HOSPITAL_COMMUNITY): Payer: Self-pay
